# Patient Record
Sex: Male | Born: 1935 | Race: White | Hispanic: No | Marital: Married | Smoking: Former smoker
Health system: Southern US, Community
[De-identification: ages and names within clinical notes are randomized; demographics above are authoritative.]

## PROBLEM LIST (undated history)

## (undated) DIAGNOSIS — M810 Age-related osteoporosis without current pathological fracture: Secondary | ICD-10-CM

## (undated) DIAGNOSIS — E119 Type 2 diabetes mellitus without complications: Secondary | ICD-10-CM

## (undated) DIAGNOSIS — M1990 Unspecified osteoarthritis, unspecified site: Secondary | ICD-10-CM

## (undated) DIAGNOSIS — M109 Gout, unspecified: Secondary | ICD-10-CM

## (undated) DIAGNOSIS — N200 Calculus of kidney: Secondary | ICD-10-CM

## (undated) DIAGNOSIS — F0280 Dementia in other diseases classified elsewhere without behavioral disturbance: Secondary | ICD-10-CM

## (undated) DIAGNOSIS — G309 Alzheimer's disease, unspecified: Secondary | ICD-10-CM

## (undated) DIAGNOSIS — I10 Essential (primary) hypertension: Secondary | ICD-10-CM

## (undated) DIAGNOSIS — M353 Polymyalgia rheumatica: Secondary | ICD-10-CM

## (undated) DIAGNOSIS — N400 Enlarged prostate without lower urinary tract symptoms: Secondary | ICD-10-CM

## (undated) DIAGNOSIS — D4959 Neoplasm of unspecified behavior of other genitourinary organ: Secondary | ICD-10-CM

## (undated) HISTORY — DX: Gout, unspecified: M10.9

## (undated) HISTORY — DX: Polymyalgia rheumatica: M35.3

## (undated) HISTORY — PX: HEMORRHOID SURGERY: SHX153

## (undated) HISTORY — DX: Dementia in other diseases classified elsewhere, unspecified severity, without behavioral disturbance, psychotic disturbance, mood disturbance, and anxiety: F02.80

## (undated) HISTORY — DX: Age-related osteoporosis without current pathological fracture: M81.0

## (undated) HISTORY — DX: Essential (primary) hypertension: I10

## (undated) HISTORY — DX: Benign prostatic hyperplasia without lower urinary tract symptoms: N40.0

## (undated) HISTORY — DX: Alzheimer's disease, unspecified: G30.9

## (undated) HISTORY — DX: Unspecified osteoarthritis, unspecified site: M19.90

## (undated) HISTORY — DX: Neoplasm of unspecified behavior of other genitourinary organ: D49.59

## (undated) HISTORY — DX: Calculus of kidney: N20.0

## (undated) HISTORY — DX: Type 2 diabetes mellitus without complications: E11.9

---

## 2014-05-16 DIAGNOSIS — M150 Primary generalized (osteo)arthritis: Secondary | ICD-10-CM | POA: Diagnosis not present

## 2014-05-16 DIAGNOSIS — E785 Hyperlipidemia, unspecified: Secondary | ICD-10-CM | POA: Diagnosis not present

## 2014-05-16 DIAGNOSIS — I10 Essential (primary) hypertension: Secondary | ICD-10-CM | POA: Diagnosis not present

## 2014-05-16 DIAGNOSIS — Z794 Long term (current) use of insulin: Secondary | ICD-10-CM | POA: Diagnosis not present

## 2014-05-16 DIAGNOSIS — Z7952 Long term (current) use of systemic steroids: Secondary | ICD-10-CM | POA: Diagnosis not present

## 2014-05-16 DIAGNOSIS — E119 Type 2 diabetes mellitus without complications: Secondary | ICD-10-CM | POA: Diagnosis not present

## 2014-05-16 DIAGNOSIS — M353 Polymyalgia rheumatica: Secondary | ICD-10-CM | POA: Diagnosis not present

## 2014-06-16 DIAGNOSIS — I10 Essential (primary) hypertension: Secondary | ICD-10-CM | POA: Diagnosis not present

## 2014-06-16 DIAGNOSIS — Z0000 Encounter for general adult medical examination without abnormal findings: Secondary | ICD-10-CM | POA: Diagnosis not present

## 2014-06-16 DIAGNOSIS — M810 Age-related osteoporosis without current pathological fracture: Secondary | ICD-10-CM | POA: Diagnosis not present

## 2014-06-16 DIAGNOSIS — M109 Gout, unspecified: Secondary | ICD-10-CM | POA: Diagnosis not present

## 2014-06-16 DIAGNOSIS — E785 Hyperlipidemia, unspecified: Secondary | ICD-10-CM | POA: Diagnosis not present

## 2014-06-16 DIAGNOSIS — E119 Type 2 diabetes mellitus without complications: Secondary | ICD-10-CM | POA: Diagnosis not present

## 2014-06-16 DIAGNOSIS — Z1389 Encounter for screening for other disorder: Secondary | ICD-10-CM | POA: Diagnosis not present

## 2014-06-16 DIAGNOSIS — M353 Polymyalgia rheumatica: Secondary | ICD-10-CM | POA: Diagnosis not present

## 2014-06-29 DIAGNOSIS — Z1212 Encounter for screening for malignant neoplasm of rectum: Secondary | ICD-10-CM | POA: Diagnosis not present

## 2014-07-05 DIAGNOSIS — I351 Nonrheumatic aortic (valve) insufficiency: Secondary | ICD-10-CM | POA: Diagnosis not present

## 2014-07-05 DIAGNOSIS — I70209 Unspecified atherosclerosis of native arteries of extremities, unspecified extremity: Secondary | ICD-10-CM | POA: Diagnosis not present

## 2014-07-05 DIAGNOSIS — I2510 Atherosclerotic heart disease of native coronary artery without angina pectoris: Secondary | ICD-10-CM | POA: Diagnosis not present

## 2014-07-06 DIAGNOSIS — R4182 Altered mental status, unspecified: Secondary | ICD-10-CM | POA: Diagnosis not present

## 2014-07-06 DIAGNOSIS — I959 Hypotension, unspecified: Secondary | ICD-10-CM | POA: Diagnosis not present

## 2014-07-06 DIAGNOSIS — I10 Essential (primary) hypertension: Secondary | ICD-10-CM | POA: Diagnosis not present

## 2014-07-18 DIAGNOSIS — R109 Unspecified abdominal pain: Secondary | ICD-10-CM | POA: Diagnosis not present

## 2014-07-18 DIAGNOSIS — K921 Melena: Secondary | ICD-10-CM | POA: Diagnosis not present

## 2014-07-18 DIAGNOSIS — D500 Iron deficiency anemia secondary to blood loss (chronic): Secondary | ICD-10-CM | POA: Diagnosis not present

## 2014-07-21 DIAGNOSIS — R634 Abnormal weight loss: Secondary | ICD-10-CM | POA: Diagnosis not present

## 2014-07-21 DIAGNOSIS — K4020 Bilateral inguinal hernia, without obstruction or gangrene, not specified as recurrent: Secondary | ICD-10-CM | POA: Diagnosis not present

## 2014-07-21 DIAGNOSIS — R102 Pelvic and perineal pain: Secondary | ICD-10-CM | POA: Diagnosis not present

## 2014-07-21 DIAGNOSIS — R109 Unspecified abdominal pain: Secondary | ICD-10-CM | POA: Diagnosis not present

## 2014-07-21 DIAGNOSIS — N261 Atrophy of kidney (terminal): Secondary | ICD-10-CM | POA: Diagnosis not present

## 2014-07-21 DIAGNOSIS — M5126 Other intervertebral disc displacement, lumbar region: Secondary | ICD-10-CM | POA: Diagnosis not present

## 2014-07-21 DIAGNOSIS — K5730 Diverticulosis of large intestine without perforation or abscess without bleeding: Secondary | ICD-10-CM | POA: Diagnosis not present

## 2014-07-31 DIAGNOSIS — I491 Atrial premature depolarization: Secondary | ICD-10-CM | POA: Diagnosis not present

## 2014-07-31 DIAGNOSIS — I498 Other specified cardiac arrhythmias: Secondary | ICD-10-CM | POA: Diagnosis not present

## 2014-07-31 DIAGNOSIS — I499 Cardiac arrhythmia, unspecified: Secondary | ICD-10-CM | POA: Diagnosis not present

## 2014-07-31 DIAGNOSIS — I350 Nonrheumatic aortic (valve) stenosis: Secondary | ICD-10-CM | POA: Diagnosis not present

## 2014-08-24 DIAGNOSIS — R5383 Other fatigue: Secondary | ICD-10-CM | POA: Diagnosis not present

## 2014-08-24 DIAGNOSIS — E119 Type 2 diabetes mellitus without complications: Secondary | ICD-10-CM | POA: Diagnosis not present

## 2014-08-24 DIAGNOSIS — N401 Enlarged prostate with lower urinary tract symptoms: Secondary | ICD-10-CM | POA: Diagnosis not present

## 2014-08-24 DIAGNOSIS — N3090 Cystitis, unspecified without hematuria: Secondary | ICD-10-CM | POA: Diagnosis not present

## 2014-08-24 DIAGNOSIS — C61 Malignant neoplasm of prostate: Secondary | ICD-10-CM | POA: Diagnosis not present

## 2014-08-24 DIAGNOSIS — I10 Essential (primary) hypertension: Secondary | ICD-10-CM | POA: Diagnosis not present

## 2014-09-14 DIAGNOSIS — J029 Acute pharyngitis, unspecified: Secondary | ICD-10-CM | POA: Diagnosis not present

## 2014-09-15 DIAGNOSIS — I10 Essential (primary) hypertension: Secondary | ICD-10-CM | POA: Diagnosis not present

## 2014-09-15 DIAGNOSIS — J309 Allergic rhinitis, unspecified: Secondary | ICD-10-CM | POA: Diagnosis not present

## 2014-09-19 DIAGNOSIS — I10 Essential (primary) hypertension: Secondary | ICD-10-CM | POA: Diagnosis not present

## 2014-09-19 DIAGNOSIS — E785 Hyperlipidemia, unspecified: Secondary | ICD-10-CM | POA: Diagnosis not present

## 2014-09-19 DIAGNOSIS — E119 Type 2 diabetes mellitus without complications: Secondary | ICD-10-CM | POA: Diagnosis not present

## 2014-09-19 DIAGNOSIS — Z794 Long term (current) use of insulin: Secondary | ICD-10-CM | POA: Diagnosis not present

## 2014-10-04 DIAGNOSIS — L578 Other skin changes due to chronic exposure to nonionizing radiation: Secondary | ICD-10-CM | POA: Diagnosis not present

## 2014-10-04 DIAGNOSIS — R600 Localized edema: Secondary | ICD-10-CM | POA: Diagnosis not present

## 2014-10-04 DIAGNOSIS — L821 Other seborrheic keratosis: Secondary | ICD-10-CM | POA: Diagnosis not present

## 2014-10-04 DIAGNOSIS — L570 Actinic keratosis: Secondary | ICD-10-CM | POA: Diagnosis not present

## 2014-12-28 DIAGNOSIS — H2703 Aphakia, bilateral: Secondary | ICD-10-CM | POA: Diagnosis not present

## 2015-02-01 DIAGNOSIS — E119 Type 2 diabetes mellitus without complications: Secondary | ICD-10-CM | POA: Diagnosis not present

## 2015-02-20 DIAGNOSIS — Z794 Long term (current) use of insulin: Secondary | ICD-10-CM | POA: Diagnosis not present

## 2015-02-20 DIAGNOSIS — E785 Hyperlipidemia, unspecified: Secondary | ICD-10-CM | POA: Diagnosis not present

## 2015-02-20 DIAGNOSIS — E119 Type 2 diabetes mellitus without complications: Secondary | ICD-10-CM | POA: Diagnosis not present

## 2015-02-20 DIAGNOSIS — I10 Essential (primary) hypertension: Secondary | ICD-10-CM | POA: Diagnosis not present

## 2015-02-22 DIAGNOSIS — N3020 Other chronic cystitis without hematuria: Secondary | ICD-10-CM | POA: Diagnosis not present

## 2015-02-22 DIAGNOSIS — C61 Malignant neoplasm of prostate: Secondary | ICD-10-CM | POA: Diagnosis not present

## 2015-02-22 DIAGNOSIS — N318 Other neuromuscular dysfunction of bladder: Secondary | ICD-10-CM | POA: Diagnosis not present

## 2015-02-22 DIAGNOSIS — E119 Type 2 diabetes mellitus without complications: Secondary | ICD-10-CM | POA: Diagnosis not present

## 2015-02-22 DIAGNOSIS — I10 Essential (primary) hypertension: Secondary | ICD-10-CM | POA: Diagnosis not present

## 2015-02-22 DIAGNOSIS — N401 Enlarged prostate with lower urinary tract symptoms: Secondary | ICD-10-CM | POA: Diagnosis not present

## 2015-03-14 DIAGNOSIS — J309 Allergic rhinitis, unspecified: Secondary | ICD-10-CM | POA: Diagnosis not present

## 2015-03-14 DIAGNOSIS — Z23 Encounter for immunization: Secondary | ICD-10-CM | POA: Diagnosis not present

## 2015-03-14 DIAGNOSIS — R0609 Other forms of dyspnea: Secondary | ICD-10-CM | POA: Diagnosis not present

## 2015-04-12 ENCOUNTER — Other Ambulatory Visit: Payer: Self-pay

## 2015-04-13 ENCOUNTER — Encounter: Payer: Self-pay | Admitting: Emergency Medicine

## 2015-04-13 ENCOUNTER — Ambulatory Visit (INDEPENDENT_AMBULATORY_CARE_PROVIDER_SITE_OTHER): Payer: Medicare Other | Admitting: Emergency Medicine

## 2015-04-13 ENCOUNTER — Ambulatory Visit (INDEPENDENT_AMBULATORY_CARE_PROVIDER_SITE_OTHER)
Admission: RE | Admit: 2015-04-13 | Discharge: 2015-04-13 | Disposition: A | Discharge status: Home / Self Care | Payer: Medicare Other | Source: Ambulatory Visit | Attending: Emergency Medicine | Admitting: Emergency Medicine

## 2015-04-13 VITALS — BP 112/62 | HR 67 | Ht 64.0 in | Wt 170.0 lb

## 2015-04-13 DIAGNOSIS — R0609 Other forms of dyspnea: Secondary | ICD-10-CM

## 2015-04-13 DIAGNOSIS — J449 Chronic obstructive pulmonary disease, unspecified: Secondary | ICD-10-CM | POA: Insufficient documentation

## 2015-04-13 DIAGNOSIS — R0602 Shortness of breath: Secondary | ICD-10-CM | POA: Diagnosis not present

## 2015-04-13 DIAGNOSIS — Z77090 Contact with and (suspected) exposure to asbestos: Secondary | ICD-10-CM | POA: Diagnosis not present

## 2015-04-13 HISTORY — DX: Chronic obstructive pulmonary disease, unspecified: J44.9

## 2015-04-13 HISTORY — DX: Contact with and (suspected) exposure to asbestos: Z77.090

## 2015-04-13 NOTE — Progress Notes (Signed)
Subjective:    Patient ID: Kevin Hines, male    DOB: 1922-11-08, 81 y.o.   MRN: [id number]  HPI 81 yo man, former remote smoker (5-10 pk-yrs), hx DM, HTN on ACE-I, polymyalgia rheumatica on Pred 5mg , Alzheimer's dementia. He is referred by Dr Nona Dell for evaluation of exertional SOB. He also goes to the [place], had CT chest March 2015 > report available. He had some lingular scar, some granulomas w calcium, some B calcified pleural plaques. He denies any CP. Wt has been stable. He is comfortable at rest, gets dyspneic w exertion. Has to rest when working outside in flower bed. Has episodes of emesis with exertion as well, especially with bending. No wheeze or noise. Rare cough. He may have felt palpitations before - he is unsure. Lots of rhinorrhea. He had a cardiac stress test 07/2014 that was reassuring.    Review of Systems  HENT: Positive for congestion, postnasal drip and sneezing.   Respiratory: Positive for shortness of breath.   Cardiovascular: Positive for palpitations and leg swelling.  Musculoskeletal: Positive for joint swelling.   Past Medical History  Diagnosis Date  . Alzheimer's disease   . Benign prostatic hypertrophy   . Diabetes mellitus ([place])   . Gout   . Hypertension   . Kidney stones   . Osteoarthritis   . Osteoporosis   . Polymyalgia rheumatica ([place])   . Prostate neoplasm      No family history on file.   Social History   Social History  . Marital Status: Married    Spouse Name: N/A  . Number of Children: N/A  . Years of Education: N/A   Occupational History  . Not on file.   Social History Main Topics  . Smoking status: Former Smoker -- 0.03 packs/day    Types: Cigarettes    Quit date: 05/13/1959  . Smokeless tobacco: Never Used  . Alcohol Use: Not on file  . Drug Use: Not on file  . Sexual Activity: Not on file   Other Topics Concern  . 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 Exposed to diesel fuel   Allergies  Allergen Reactions  . Metformin And Related Other (See Comments)     Outpatient Prescriptions Prior to Visit  Medication Sig Dispense Refill  . alendronate (FOSAMAX) 70 MG tablet Take 70 mg by mouth once a week. Take with a full glass of water on an empty stomach.    Marland Kitchen allopurinol (ZYLOPRIM) 300 MG tablet Take 300 mg by mouth daily.    Marland Kitchen amLODipine (NORVASC) 10 MG tablet Take 10 mg by mouth daily.    Marland Kitchen aspirin 81 MG tablet Take 81 mg by mouth daily.    . Cholecalciferol (VITAMIN D3) 10000 UNITS TABS Take 1 tablet by mouth daily.    . hydrALAZINE (APRESOLINE) 25 MG tablet Take 25 mg by mouth 2 (two) times daily.    . hydrochlorothiazide (HYDRODIURIL) 25 MG tablet Take 25 mg by mouth daily.    . insulin aspart (NOVOLOG) 100 UNIT/ML injection Inject 12 Units into the skin 3 (three) times daily with meals.    . insulin glargine (LANTUS) 100 UNIT/ML injection Inject 22 Units into the skin daily.    Marland Kitchen lisinopril (PRINIVIL,ZESTRIL) 40 MG tablet Take 40 mg by mouth 2 (two) times daily.    . metoprolol tartrate (LOPRESSOR) 25 MG tablet Take 25 mg by  mouth 2 (two) times daily.    . Omega-3 Fatty Acids (FISH OIL) 500 MG CAPS Take 1 capsule by mouth 2 (two) times daily.    . pravastatin (PRAVACHOL) 80 MG tablet Take 80 mg by mouth daily.    . predniSONE (DELTASONE) 5 MG tablet Take 5 mg by mouth daily with breakfast.     No facility-administered medications prior to visit.         Objective:   Physical Exam Filed Vitals:   04/13/15 1527  BP: 112/62  Pulse: 67  Height: 5\' 4"  (1.626 m)  Weight: 170 lb (77.111 kg)  SpO2: 95%   'Gen: Pleasant, well-nourished, in no distress,  normal affect  ENT: No lesions,  mouth clear,  oropharynx clear, no postnasal drip  Neck: No JVD, no stridor  Lungs: No use of accessory muscles, clear without rales or rhonchi  Cardiovascular: RRR, heart sounds normal, no murmur or gallops, trace pretibial  peripheral edema  Musculoskeletal: No deformities, no cyanosis or clubbing  Neuro: alert, non focal  Skin: Warm, no lesions or rashes       Assessment & Plan:  Dyspnea on exertion Differential diagnosis is broad and could include obstructive lung disease, pleural disease or pleural effusion from presumed asbestos exposure although I do not hear any evidence of an effusion on exam today. Suspect that there may be some component of deconditioning. We  will perform pulmonary function testing and if any evidence of obstruction we will try bd's. CXR today. Suspect he will need serial chest x-rays versus CT scans to follow pleural plaques.   Asbestos exposure This is presumed based on CT scan report from March 2015 from the [place]. He will need follow-up imaging. He may be able to be followed by Dr Gwenette Greet who is now seeing pt's in [place].

## 2015-04-13 NOTE — Assessment & Plan Note (Signed)
This is presumed based on CT scan report from March 2015 from the [place]. He will need follow-up imaging. He may be able to be followed by Dr Gwenette Greet who is now seeing pt's in [place].

## 2015-04-13 NOTE — Assessment & Plan Note (Signed)
Differential diagnosis is broad and could include obstructive lung disease, pleural disease or pleural effusion from presumed asbestos exposure although I do not hear any evidence of an effusion on exam today. Suspect that there may be some component of deconditioning. We  will perform pulmonary function testing and if any evidence of obstruction we will try bd's. CXR today. Suspect he will need serial chest x-rays versus CT scans to follow pleural plaques.

## 2015-04-13 NOTE — Patient Instructions (Addendum)
We will perform a CXR today We will arrange for pulmonary function testing.  Follow with Dr Lamonte Sakai next available to review.

## 2015-05-17 DIAGNOSIS — M150 Primary generalized (osteo)arthritis: Secondary | ICD-10-CM | POA: Diagnosis not present

## 2015-05-17 DIAGNOSIS — Z79899 Other long term (current) drug therapy: Secondary | ICD-10-CM | POA: Diagnosis not present

## 2015-05-17 DIAGNOSIS — M353 Polymyalgia rheumatica: Secondary | ICD-10-CM | POA: Diagnosis not present

## 2015-06-22 DIAGNOSIS — N3090 Cystitis, unspecified without hematuria: Secondary | ICD-10-CM | POA: Diagnosis not present

## 2015-06-22 DIAGNOSIS — N401 Enlarged prostate with lower urinary tract symptoms: Secondary | ICD-10-CM | POA: Diagnosis not present

## 2015-06-22 DIAGNOSIS — N3020 Other chronic cystitis without hematuria: Secondary | ICD-10-CM | POA: Diagnosis not present

## 2015-06-22 DIAGNOSIS — I10 Essential (primary) hypertension: Secondary | ICD-10-CM | POA: Diagnosis not present

## 2015-06-22 DIAGNOSIS — E1010 Type 1 diabetes mellitus with ketoacidosis without coma: Secondary | ICD-10-CM | POA: Diagnosis not present

## 2015-06-22 DIAGNOSIS — N318 Other neuromuscular dysfunction of bladder: Secondary | ICD-10-CM | POA: Diagnosis not present

## 2015-06-22 DIAGNOSIS — C61 Malignant neoplasm of prostate: Secondary | ICD-10-CM | POA: Diagnosis not present

## 2015-06-26 DIAGNOSIS — M1A9XX0 Chronic gout, unspecified, without tophus (tophi): Secondary | ICD-10-CM | POA: Diagnosis not present

## 2015-06-26 DIAGNOSIS — E559 Vitamin D deficiency, unspecified: Secondary | ICD-10-CM | POA: Diagnosis not present

## 2015-06-26 DIAGNOSIS — N400 Enlarged prostate without lower urinary tract symptoms: Secondary | ICD-10-CM | POA: Diagnosis not present

## 2015-06-26 DIAGNOSIS — Z0000 Encounter for general adult medical examination without abnormal findings: Secondary | ICD-10-CM | POA: Diagnosis not present

## 2015-06-26 DIAGNOSIS — I480 Paroxysmal atrial fibrillation: Secondary | ICD-10-CM | POA: Diagnosis not present

## 2015-06-26 DIAGNOSIS — I10 Essential (primary) hypertension: Secondary | ICD-10-CM | POA: Diagnosis not present

## 2015-06-26 DIAGNOSIS — Z1389 Encounter for screening for other disorder: Secondary | ICD-10-CM | POA: Diagnosis not present

## 2015-06-26 DIAGNOSIS — M810 Age-related osteoporosis without current pathological fracture: Secondary | ICD-10-CM | POA: Diagnosis not present

## 2015-06-26 DIAGNOSIS — E119 Type 2 diabetes mellitus without complications: Secondary | ICD-10-CM | POA: Diagnosis not present

## 2015-06-28 DIAGNOSIS — N400 Enlarged prostate without lower urinary tract symptoms: Secondary | ICD-10-CM | POA: Diagnosis not present

## 2015-06-29 DIAGNOSIS — M818 Other osteoporosis without current pathological fracture: Secondary | ICD-10-CM | POA: Diagnosis not present

## 2015-06-29 DIAGNOSIS — M810 Age-related osteoporosis without current pathological fracture: Secondary | ICD-10-CM | POA: Diagnosis not present

## 2015-06-29 DIAGNOSIS — C61 Malignant neoplasm of prostate: Secondary | ICD-10-CM | POA: Diagnosis not present

## 2015-07-03 ENCOUNTER — Encounter: Payer: Self-pay | Admitting: Emergency Medicine

## 2015-07-03 ENCOUNTER — Ambulatory Visit (INDEPENDENT_AMBULATORY_CARE_PROVIDER_SITE_OTHER): Payer: Medicare Other | Admitting: Emergency Medicine

## 2015-07-03 VITALS — BP 126/58 | HR 84 | Ht 64.0 in | Wt 170.8 lb

## 2015-07-03 DIAGNOSIS — R0609 Other forms of dyspnea: Secondary | ICD-10-CM | POA: Diagnosis not present

## 2015-07-03 DIAGNOSIS — Z77090 Contact with and (suspected) exposure to asbestos: Secondary | ICD-10-CM | POA: Diagnosis not present

## 2015-07-03 DIAGNOSIS — R0600 Dyspnea, unspecified: Secondary | ICD-10-CM

## 2015-07-03 NOTE — Assessment & Plan Note (Signed)
He does not have wheezing on exam. I would like for him to do spirometry today but if this is reassuring think we can almost certainly ascribed some of his exertional dyspnea due to deconditioning. No new effusion on chest x-ray.  We can consider bronchodilators if there is evidence for obstruction but otherwise I would like for him to undertake a steady conservative exercise regimen to try and build up his stamina.

## 2015-07-03 NOTE — Patient Instructions (Addendum)
Your pulmonary function testing today shows some evidence for an airflow abnormality. We should do a trial of albuterol, 2 puffs up to every 4 hours if needed for shortness of breath, to see if you benefit from this. You do not need to use this on a schedule, only when breathing becomes difficult. Keep track of whether you benefit so we can decide whether this is a medication that we can continue.  Follow with Dr Lamonte Sakai in 4 months or sooner if you have any problems.

## 2015-07-03 NOTE — Assessment & Plan Note (Addendum)
His chest x-ray was reassuring from December 2016. No evidence of an evolving effusion. He does not have wheezing on exam. I would like for him to do spirometry today but if this is reassuring think we can almost certainly ascribed some of his exertional dyspnea due to deconditioning. His last CT scan of the chest appears to have been approximately 2 years ago. He will need his pleural plaques to be followed. I will offer to do this here or to have this done through the [place] system. He may want to follow with Dr Gwenette Greet and if so I will work on setting this up.

## 2015-07-03 NOTE — Progress Notes (Signed)
Subjective:    Patient ID: Kevin Hines, male    DOB: 07-11-1922, 82 y.o.   MRN: [id number]  HPI 82 yo man, former remote smoker (5-10 pk-yrs), hx DM, HTN on ACE-I, polymyalgia rheumatica on Pred 5mg , Alzheimer's dementia. He is referred by Dr Nona Dell for evaluation of exertional SOB. He also goes to the [place], had CT chest March 2015 > report available. He had some lingular scar, some granulomas w calcium, some B calcified pleural plaques. He denies any CP. Wt has been stable. He is comfortable at rest, gets dyspneic w exertion. Has to rest when working outside in flower bed. Has episodes of emesis with exertion as well, especially with bending. No wheeze or noise. Rare cough. He may have felt palpitations before - he is unsure. Lots of rhinorrhea. He had a cardiac stress test 07/2014 that was reassuring.   ROV 07/03/15 -- follow-up visit for dyspnea on exertion. He has a history of reported abnormal CT scan of the chest with an effusion and some bilateral calcified pleural plaques. A chest x-ray performed at our last visit on 04/13/15 did show a 7 mm calcified right upper lobe nodule without any other significant abnormalities and with no large pleural effusion.  His breathing hasn't changed much, happens when he walks uphill but not on flat ground.    Review of Systems  HENT: Positive for congestion, postnasal drip and sneezing.   Respiratory: Positive for shortness of breath.   Cardiovascular: Positive for palpitations and leg swelling.  Musculoskeletal: Positive for joint swelling.    Past Medical History  Diagnosis Date  . Alzheimer's disease   . Benign prostatic hypertrophy   . Diabetes mellitus ([place])   . Gout   . Hypertension   . Kidney stones   . Osteoarthritis   . Osteoporosis   . Polymyalgia rheumatica ([place])   . Prostate neoplasm      No family history on file.   Social History   Social History  . Marital Status: Married    Spouse Name: N/A  . Number of Children: N/A    . Years of Education: N/A   Occupational History  . Not on file.   Social History Main Topics  . Smoking status: Former Smoker -- 0.03 packs/day    Types: Cigarettes    Quit date: 05/13/1959  . Smokeless tobacco: Never Used  . Alcohol Use: Not on file  . Drug Use: Not on file  . Sexual Activity: Not on file   Other Topics Concern  . 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 Metformin And Related Other (See Comments)     Outpatient Prescriptions Prior to Visit  Medication Sig Dispense Refill  . alendronate (FOSAMAX) 70 MG tablet Take 70 mg by mouth once a week. Take with a full glass of water on an empty stomach.    Marland Kitchen allopurinol (ZYLOPRIM) 300 MG tablet Take 300 mg by mouth daily.    Marland Kitchen amLODipine (NORVASC) 10 MG tablet Take 10 mg by mouth daily.    Marland Kitchen aspirin 81 MG tablet Take 81 mg by mouth daily.    . Cholecalciferol (VITAMIN D3) 10000 UNITS TABS Take 1 tablet by mouth daily.    . hydrALAZINE (APRESOLINE) 25 MG tablet Take 25 mg by mouth 2 (two) times daily.    . hydrochlorothiazide (  HYDRODIURIL) 25 MG tablet Take 25 mg by mouth daily.    . insulin aspart (NOVOLOG) 100 UNIT/ML injection Inject 12 Units into the skin 3 (three) times daily with meals.    . insulin glargine (LANTUS) 100 UNIT/ML injection Inject 22 Units into the skin daily.    Marland Kitchen lisinopril (PRINIVIL,ZESTRIL) 40 MG tablet Take 40 mg by mouth 2 (two) times daily.    . metoprolol tartrate (LOPRESSOR) 25 MG tablet Take 25 mg by mouth 2 (two) times daily.    . Omega-3 Fatty Acids (FISH OIL) 500 MG CAPS Take 1 capsule by mouth 2 (two) times daily.    . pravastatin (PRAVACHOL) 80 MG tablet Take 80 mg by mouth daily.    . predniSONE (DELTASONE) 5 MG tablet Take 5 mg by mouth daily with breakfast. Pt reported only taking 2.5mg  daily     No facility-administered medications prior to  visit.         Objective:   Physical Exam Filed Vitals:   07/03/15 1355  BP: 126/58  Pulse: 84  Height: 5\' 4"  (1.626 m)  Weight: 170 lb 12.8 oz (77.474 kg)  SpO2: 97%   'Gen: Pleasant, well-nourished, in no distress,  normal affect  ENT: No lesions,  mouth clear,  oropharynx clear, no postnasal drip  Neck: No JVD, no stridor  Lungs: No use of accessory muscles, clear without rales or rhonchi  Cardiovascular: RRR, heart sounds normal, no murmur or gallops, trace pretibial peripheral edema  Musculoskeletal: No deformities, no cyanosis or clubbing  Neuro: alert, non focal  Skin: Warm, no lesions or rashes       Assessment & Plan:  Asbestos exposure His chest x-ray was reassuring from December 2016. No evidence of an evolving effusion. He does not have wheezing on exam. I would like for him to do spirometry today but if this is reassuring think we can almost certainly ascribed some of his exertional dyspnea due to deconditioning. His last CT scan of the chest appears to have been approximately 2 years ago. He will need his pleural plaques to be followed. I will offer to do this here or to have this done through the [place] system. He may want to follow with Dr Gwenette Greet and if so I will work on setting this up.   Dyspnea on exertion He does not have wheezing on exam. I would like for him to do spirometry today but if this is reassuring think we can almost certainly ascribed some of his exertional dyspnea due to deconditioning. No new effusion on chest x-ray.  We can consider bronchodilators if there is evidence for obstruction but otherwise I would like for him to undertake a steady conservative exercise regimen to try and build up his stamina.

## 2015-07-04 ENCOUNTER — Telehealth: Payer: Self-pay | Admitting: Emergency Medicine

## 2015-07-04 MED ORDER — ALBUTEROL SULFATE HFA 108 (90 BASE) MCG/ACT IN AERS: 2.0000 | INHALATION_SPRAY | RESPIRATORY_TRACT | Status: DC | PRN

## 2015-07-04 NOTE — Telephone Encounter (Signed)
Per 07/03/15 OV: Patient Instructions       Your pulmonary function testing today shows some evidence for an airflow abnormality. We should do a trial of albuterol, 2 puffs up to every 4 hours if needed for shortness of breath, to see if you benefit from this. You do not need to use this on a schedule, only when breathing becomes difficult. Keep track of whether you benefit so we can decide whether this is a medication that we can continue.  Follow with Dr Lamonte Sakai in 4 months or sooner if you have any problems  ---- Called spoke with spouse. The albuterol inhaler was not called in from [facility]. I have done so. Nothing further needed

## 2015-07-24 DIAGNOSIS — I10 Essential (primary) hypertension: Secondary | ICD-10-CM

## 2015-07-24 DIAGNOSIS — E119 Type 2 diabetes mellitus without complications: Secondary | ICD-10-CM | POA: Insufficient documentation

## 2015-07-24 DIAGNOSIS — Z794 Long term (current) use of insulin: Secondary | ICD-10-CM

## 2015-07-24 DIAGNOSIS — Z92241 Personal history of systemic steroid therapy: Secondary | ICD-10-CM

## 2015-07-24 DIAGNOSIS — E785 Hyperlipidemia, unspecified: Secondary | ICD-10-CM

## 2015-07-24 HISTORY — DX: Type 2 diabetes mellitus without complications: E11.9

## 2015-07-24 HISTORY — DX: Essential (primary) hypertension: I10

## 2015-07-24 HISTORY — DX: Hyperlipidemia, unspecified: E78.5

## 2015-07-24 HISTORY — DX: Type 2 diabetes mellitus without complications: Z79.4

## 2015-07-24 HISTORY — DX: Personal history of systemic steroid therapy: Z92.241

## 2015-08-24 DIAGNOSIS — E119 Type 2 diabetes mellitus without complications: Secondary | ICD-10-CM | POA: Diagnosis not present

## 2015-11-01 ENCOUNTER — Encounter: Payer: Self-pay | Admitting: Emergency Medicine

## 2015-11-01 ENCOUNTER — Ambulatory Visit (INDEPENDENT_AMBULATORY_CARE_PROVIDER_SITE_OTHER): Payer: Medicare Other | Admitting: Emergency Medicine

## 2015-11-01 VITALS — BP 112/58 | HR 65 | Ht 64.0 in | Wt 172.0 lb

## 2015-11-01 DIAGNOSIS — J449 Chronic obstructive pulmonary disease, unspecified: Secondary | ICD-10-CM | POA: Diagnosis not present

## 2015-11-01 MED ORDER — ALBUTEROL SULFATE HFA 108 (90 BASE) MCG/ACT IN AERS: 2.0000 | INHALATION_SPRAY | RESPIRATORY_TRACT | Status: DC | PRN

## 2015-11-01 NOTE — Addendum Note (Signed)
Addended by: Beckie Busing on: 11/01/2015 09:38 AM   Modules accepted: Orders

## 2015-11-01 NOTE — Progress Notes (Signed)
Subjective:    Patient ID: Kevin Hines, male    DOB: Mar 22, 1923, 82 y.o.   MRN: [id number]  HPI 82 yo man, former remote smoker (5-10 pk-yrs), hx DM, HTN on ACE-I, polymyalgia rheumatica on Pred 5mg , Alzheimer's dementia. He is referred by Dr Nona Dell for evaluation of exertional SOB. He also goes to the [place], had CT chest March 2015 > report available. He had some lingular scar, some granulomas w calcium, some B calcified pleural plaques. He denies any CP. Wt has been stable. He is comfortable at rest, gets dyspneic w exertion. Has to rest when working outside in flower bed. Has episodes of emesis with exertion as well, especially with bending. No wheeze or noise. Rare cough. He may have felt palpitations before - he is unsure. Lots of rhinorrhea. He had a cardiac stress test 07/2014 that was reassuring.   ROV 07/03/15 -- follow-up visit for dyspnea on exertion. He has a history of reported abnormal CT scan of the chest with an effusion and some bilateral calcified pleural plaques. A chest x-ray performed at our last visit on 04/13/15 did show a 7 mm calcified right upper lobe nodule without any other significant abnormalities and with no large pleural effusion.  His breathing hasn't changed much, happens when he walks uphill but not on flat ground.   ROV 10/31/15 -- patient follows up for history of tobacco use,abnormal CT scan of the chest with some granulomatous disease, calcified nodules and bilateral calcified pleural plaquing. Last seen in February. He has some documented airflow obstruction on spirometry.  He has been using SABA prn - has used with some DOE with exertion. He has noticed a benefit, notices that he can do a bit more, recovers quickly.    Review of Systems  HENT: Positive for postnasal drip and sneezing. Negative for congestion.   Respiratory: Positive for shortness of breath.   Cardiovascular: Positive for leg swelling. Negative for palpitations.  Musculoskeletal: Negative for  joint swelling.    Past Medical History  Diagnosis Date  . Alzheimer's disease   . Benign prostatic hypertrophy   . Diabetes mellitus ([place])   . Gout   . Hypertension   . Kidney stones   . Osteoarthritis   . Osteoporosis   . Polymyalgia rheumatica ([place])   . Prostate neoplasm      No family history on file.   Social History   Social History  . Marital Status: Married    Spouse Name: N/A  . Number of Children: N/A  . Years of Education: N/A   Occupational History  . Not on file.   Social History Main Topics  . Smoking status: Former Smoker -- 0.03 packs/day    Types: Cigarettes    Quit date: 05/13/1959  . Smokeless tobacco: Never Used  . Alcohol Use: Not on file  . Drug Use: Not on file  . Sexual Activity: Not on file   Other Topics Concern  . 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 Metformin And Related Other (See Comments)     Outpatient Prescriptions Prior to Visit  Medication Sig Dispense Refill  . albuterol (PROVENTIL HFA;VENTOLIN HFA) 108 (90 Base) MCG/ACT inhaler Inhale 2 puffs into the lungs every 4 (four) hours as needed for wheezing or shortness of breath. 1 Inhaler 6  . alendronate (FOSAMAX)  70 MG tablet Take 70 mg by mouth once a week. Take with a full glass of water on an empty stomach.    Marland Kitchen allopurinol (ZYLOPRIM) 300 MG tablet Take 300 mg by mouth daily.    Marland Kitchen amLODipine (NORVASC) 10 MG tablet Take 10 mg by mouth daily.    Marland Kitchen aspirin 81 MG tablet Take 81 mg by mouth daily.    . Cholecalciferol (VITAMIN D3) 10000 UNITS TABS Take 1 tablet by mouth daily.    . hydrALAZINE (APRESOLINE) 25 MG tablet Take 25 mg by mouth 2 (two) times daily.    . hydrochlorothiazide (HYDRODIURIL) 25 MG tablet Take 25 mg by mouth daily.    . insulin aspart (NOVOLOG) 100 UNIT/ML injection Inject 12 Units into the skin 3 (three) times daily  with meals.    . insulin glargine (LANTUS) 100 UNIT/ML injection Inject 22 Units into the skin daily.    Marland Kitchen lisinopril (PRINIVIL,ZESTRIL) 40 MG tablet Take 40 mg by mouth 2 (two) times daily.    . metoprolol tartrate (LOPRESSOR) 25 MG tablet Take 25 mg by mouth 2 (two) times daily.    . Omega-3 Fatty Acids (FISH OIL) 500 MG CAPS Take 1 capsule by mouth 2 (two) times daily.    . pravastatin (PRAVACHOL) 80 MG tablet Take 80 mg by mouth daily.    . predniSONE (DELTASONE) 5 MG tablet Take 5 mg by mouth daily with breakfast. Pt reported only taking 2.5mg  daily     No facility-administered medications prior to visit.         Objective:   Physical Exam Filed Vitals:   11/01/15 0905  BP: 112/58  Pulse: 65  Height: 5\' 4"  (1.626 m)  Weight: 172 lb (78.019 kg)  SpO2: 94%   'Gen: Pleasant, well-nourished, in no distress,  normal affect  ENT: No lesions,  mouth clear,  oropharynx clear, no postnasal drip  Neck: No JVD, no stridor  Lungs: No use of accessory muscles, clear without rales or rhonchi  Cardiovascular: RRR, heart sounds normal, no murmur or gallops, trace pretibial peripheral edema  Musculoskeletal: No deformities, no cyanosis or clubbing  Neuro: alert, non focal  Skin: Warm, no lesions or rashes       Assessment & Plan:  COPD (chronic obstructive pulmonary disease) (HCC) Obstruction on spirometry and dyspnea that has improved with albuterol both consistent with mild COPD. He seems to benefit from albuterol, is using it 1-2 times a week. We discussed possibly starting a long-acting schedule bronchodilator but at this time we will defer. He will continue to use his albuterol, keep [place] apprised of how frequently he requires it. If his usage increases we will consider long-acting meds. Follow-up in 12 months or when necessary

## 2015-11-01 NOTE — Patient Instructions (Signed)
We will continue your ProAir albuterol 2 puffs up to every 4 hours if needed for shortness of breath.  Depending how you are doing, how much inhaler you are using we will decide in the future whether to change the medication to an every-day inhaler.  Follow with Dr Lamonte Sakai in 12 months or sooner if you have any problems

## 2015-11-01 NOTE — Assessment & Plan Note (Signed)
Obstruction on spirometry and dyspnea that has improved with albuterol both consistent with mild COPD. He seems to benefit from albuterol, is using it 1-2 times a week. We discussed possibly starting a long-acting schedule bronchodilator but at this time we will defer. He will continue to use his albuterol, keep [place] apprised of how frequently he requires it. If his usage increases we will consider long-acting meds. Follow-up in 12 months or when necessary

## 2015-12-20 DIAGNOSIS — N3020 Other chronic cystitis without hematuria: Secondary | ICD-10-CM | POA: Diagnosis not present

## 2015-12-20 DIAGNOSIS — N318 Other neuromuscular dysfunction of bladder: Secondary | ICD-10-CM | POA: Diagnosis not present

## 2015-12-20 DIAGNOSIS — C61 Malignant neoplasm of prostate: Secondary | ICD-10-CM | POA: Diagnosis not present

## 2015-12-20 DIAGNOSIS — I10 Essential (primary) hypertension: Secondary | ICD-10-CM | POA: Diagnosis not present

## 2015-12-26 DIAGNOSIS — E119 Type 2 diabetes mellitus without complications: Secondary | ICD-10-CM | POA: Diagnosis not present

## 2015-12-26 DIAGNOSIS — I10 Essential (primary) hypertension: Secondary | ICD-10-CM | POA: Diagnosis not present

## 2015-12-26 DIAGNOSIS — I4891 Unspecified atrial fibrillation: Secondary | ICD-10-CM | POA: Diagnosis not present

## 2015-12-31 ENCOUNTER — Telehealth: Payer: Self-pay | Admitting: Emergency Medicine

## 2015-12-31 MED ORDER — ALBUTEROL SULFATE HFA 108 (90 BASE) MCG/ACT IN AERS: 2.0000 | INHALATION_SPRAY | RESPIRATORY_TRACT | 6 refills | Status: DC | PRN

## 2015-12-31 NOTE — Telephone Encounter (Signed)
Rx has been signed and faxed in. Nothing further was needed.

## 2015-12-31 NOTE — Telephone Encounter (Signed)
Called and spoke with pts daughter and she is requesting that [facility] H[facility] Herold Harms.  This rx has been printed off and placed on RB desk to be signed. Will forward to lindsay to follow up on. thanks

## 2016-01-04 DIAGNOSIS — H2703 Aphakia, bilateral: Secondary | ICD-10-CM | POA: Diagnosis not present

## 2016-01-04 DIAGNOSIS — E119 Type 2 diabetes mellitus without complications: Secondary | ICD-10-CM | POA: Diagnosis not present

## 2016-01-24 DIAGNOSIS — I10 Essential (primary) hypertension: Secondary | ICD-10-CM | POA: Diagnosis not present

## 2016-01-24 DIAGNOSIS — E785 Hyperlipidemia, unspecified: Secondary | ICD-10-CM | POA: Diagnosis not present

## 2016-01-24 DIAGNOSIS — E119 Type 2 diabetes mellitus without complications: Secondary | ICD-10-CM | POA: Diagnosis not present

## 2016-01-24 DIAGNOSIS — Z794 Long term (current) use of insulin: Secondary | ICD-10-CM | POA: Diagnosis not present

## 2016-02-11 DIAGNOSIS — Z23 Encounter for immunization: Secondary | ICD-10-CM | POA: Diagnosis not present

## 2016-02-18 DIAGNOSIS — M810 Age-related osteoporosis without current pathological fracture: Secondary | ICD-10-CM | POA: Diagnosis not present

## 2016-02-25 DIAGNOSIS — M818 Other osteoporosis without current pathological fracture: Secondary | ICD-10-CM | POA: Diagnosis not present

## 2016-03-19 DIAGNOSIS — C61 Malignant neoplasm of prostate: Secondary | ICD-10-CM | POA: Diagnosis not present

## 2016-03-20 DIAGNOSIS — K5900 Constipation, unspecified: Secondary | ICD-10-CM | POA: Diagnosis not present

## 2016-06-16 DIAGNOSIS — M150 Primary generalized (osteo)arthritis: Secondary | ICD-10-CM | POA: Diagnosis not present

## 2016-06-16 DIAGNOSIS — Z79899 Other long term (current) drug therapy: Secondary | ICD-10-CM | POA: Diagnosis not present

## 2016-06-16 DIAGNOSIS — M353 Polymyalgia rheumatica: Secondary | ICD-10-CM | POA: Diagnosis not present

## 2016-06-19 DIAGNOSIS — C61 Malignant neoplasm of prostate: Secondary | ICD-10-CM | POA: Diagnosis not present

## 2016-06-19 DIAGNOSIS — N318 Other neuromuscular dysfunction of bladder: Secondary | ICD-10-CM | POA: Diagnosis not present

## 2016-06-19 DIAGNOSIS — N3020 Other chronic cystitis without hematuria: Secondary | ICD-10-CM | POA: Diagnosis not present

## 2016-07-04 DIAGNOSIS — E119 Type 2 diabetes mellitus without complications: Secondary | ICD-10-CM | POA: Diagnosis not present

## 2016-07-04 DIAGNOSIS — E7800 Pure hypercholesterolemia, unspecified: Secondary | ICD-10-CM | POA: Diagnosis not present

## 2016-07-04 DIAGNOSIS — Z0000 Encounter for general adult medical examination without abnormal findings: Secondary | ICD-10-CM | POA: Diagnosis not present

## 2016-07-04 DIAGNOSIS — G309 Alzheimer's disease, unspecified: Secondary | ICD-10-CM | POA: Diagnosis not present

## 2016-07-04 DIAGNOSIS — Z1389 Encounter for screening for other disorder: Secondary | ICD-10-CM | POA: Diagnosis not present

## 2016-07-04 DIAGNOSIS — E559 Vitamin D deficiency, unspecified: Secondary | ICD-10-CM | POA: Diagnosis not present

## 2016-07-04 DIAGNOSIS — I10 Essential (primary) hypertension: Secondary | ICD-10-CM | POA: Diagnosis not present

## 2016-07-23 DIAGNOSIS — E119 Type 2 diabetes mellitus without complications: Secondary | ICD-10-CM | POA: Diagnosis not present

## 2016-07-23 DIAGNOSIS — E785 Hyperlipidemia, unspecified: Secondary | ICD-10-CM | POA: Diagnosis not present

## 2016-07-23 DIAGNOSIS — Z794 Long term (current) use of insulin: Secondary | ICD-10-CM | POA: Diagnosis not present

## 2016-07-23 DIAGNOSIS — I10 Essential (primary) hypertension: Secondary | ICD-10-CM | POA: Diagnosis not present

## 2016-07-28 DIAGNOSIS — Z1212 Encounter for screening for malignant neoplasm of rectum: Secondary | ICD-10-CM | POA: Diagnosis not present

## 2016-08-07 DIAGNOSIS — J40 Bronchitis, not specified as acute or chronic: Secondary | ICD-10-CM | POA: Diagnosis not present

## 2016-09-03 DIAGNOSIS — F0390 Unspecified dementia without behavioral disturbance: Secondary | ICD-10-CM | POA: Insufficient documentation

## 2016-09-03 DIAGNOSIS — C61 Malignant neoplasm of prostate: Secondary | ICD-10-CM

## 2016-09-03 DIAGNOSIS — R319 Hematuria, unspecified: Secondary | ICD-10-CM

## 2016-09-03 DIAGNOSIS — K621 Rectal polyp: Secondary | ICD-10-CM | POA: Insufficient documentation

## 2016-09-03 HISTORY — DX: Hematuria, unspecified: R31.9

## 2016-09-03 HISTORY — DX: Rectal polyp: K62.1

## 2016-09-03 HISTORY — DX: Malignant neoplasm of prostate: C61

## 2016-09-25 DIAGNOSIS — N200 Calculus of kidney: Secondary | ICD-10-CM

## 2016-09-25 HISTORY — DX: Calculus of kidney: N20.0

## 2016-09-30 DIAGNOSIS — M353 Polymyalgia rheumatica: Secondary | ICD-10-CM | POA: Insufficient documentation

## 2016-11-03 ENCOUNTER — Ambulatory Visit (INDEPENDENT_AMBULATORY_CARE_PROVIDER_SITE_OTHER): Payer: Medicare Other | Admitting: Emergency Medicine

## 2016-11-03 ENCOUNTER — Encounter: Payer: Self-pay | Admitting: Emergency Medicine

## 2016-11-03 ENCOUNTER — Ambulatory Visit (INDEPENDENT_AMBULATORY_CARE_PROVIDER_SITE_OTHER)
Admission: RE | Admit: 2016-11-03 | Discharge: 2016-11-03 | Disposition: A | Discharge status: Home / Self Care | Payer: Medicare Other | Source: Ambulatory Visit | Attending: Emergency Medicine | Admitting: Emergency Medicine

## 2016-11-03 VITALS — BP 122/87 | HR 78 | Ht 64.0 in | Wt 162.0 lb

## 2016-11-03 DIAGNOSIS — J929 Pleural plaque without asbestos: Secondary | ICD-10-CM | POA: Diagnosis not present

## 2016-11-03 DIAGNOSIS — Z77090 Contact with and (suspected) exposure to asbestos: Secondary | ICD-10-CM

## 2016-11-03 MED ORDER — ALBUTEROL SULFATE HFA 108 (90 BASE) MCG/ACT IN AERS: 2.0000 | INHALATION_SPRAY | RESPIRATORY_TRACT | 6 refills | Status: DC | PRN

## 2016-11-03 MED ORDER — ALBUTEROL SULFATE HFA 108 (90 BASE) MCG/ACT IN AERS: 2.0000 | INHALATION_SPRAY | RESPIRATORY_TRACT | 6 refills | Status: AC | PRN

## 2016-11-03 NOTE — Assessment & Plan Note (Signed)
With pleural plaques noted on CT. We've been following these with serial chest x-rays and they have been stable. B chest x-ray today. He will continue to follow annually with his primary care physician Dr. Garlon Hatchet. If any change or any new concerning symptoms then I've asked him to come back or to be referred back so that we can troubleshoot.

## 2016-11-03 NOTE — Assessment & Plan Note (Signed)
Managing with albuterol as needed. Discussed possibly starting maintenance daily medication but for now he would like to defer. I believe that this is reasonable. Follow-up when necessary.

## 2016-11-03 NOTE — Progress Notes (Signed)
Subjective:    Patient ID: Kevin Hines, male    DOB: 10/12/1922, 83 y.o.   MRN: [id number]  HPI 83 yo man, former remote smoker (5-10 pk-yrs), hx DM, HTN on ACE-I, polymyalgia rheumatica on Pred 5mg , Alzheimer's dementia. He is referred by Dr Nona Dell for evaluation of exertional SOB. He also goes to the [place], had CT chest March 2015 > report available. He had some lingular scar, some granulomas w calcium, some B calcified pleural plaques. He denies any CP. Wt has been stable. He is comfortable at rest, gets dyspneic w exertion. Has to rest when working outside in flower bed. Has episodes of emesis with exertion as well, especially with bending. No wheeze or noise. Rare cough. He may have felt palpitations before - he is unsure. Lots of rhinorrhea. He had a cardiac stress test 07/2014 that was reassuring.   ROV 07/03/15 -- follow-up visit for dyspnea on exertion. He has a history of reported abnormal CT scan of the chest with an effusion and some bilateral calcified pleural plaques. A chest x-ray performed at our last visit on 04/13/15 did show a 7 mm calcified right upper lobe nodule without any other significant abnormalities and with no large pleural effusion.  His breathing hasn't changed much, happens when he walks uphill but not on flat ground.   ROV 10/31/15 -- patient follows up for history of tobacco use,abnormal CT scan of the chest with some granulomatous disease, calcified nodules and bilateral calcified pleural plaquing. Last seen in February. He has some documented airflow obstruction on spirometry.  He has been using SABA prn - has used with some DOE with exertion. He has noticed a benefit, notices that he can do a bit more, recovers quickly.   ROV 11/03/16 -- 83 year old gentleman last seen one year ago for his history of abnormal CT scan of the chest (granulomatous disease, calcified nodules, bilateral calcified pleural plaquing) and mild obstructive lung disease. He has a history of  tobacco use and has using albuterol as needed. He reports that he uses approximately . Last CT scan available was performed 07/21/13. He had a chest x-ray 04/13/15 that showed hyperinflation, 7 mm right upper lobe primary nodule with calcium, no pleural effusion. He reports that his breathing has been fair;ly stable. He does not use albuterol every day, uses it for SOB w exertion - seems to help him.   New PCP is Teressa Lower in [place].    Review of Systems  HENT: Positive for postnasal drip and sneezing. Negative for congestion.   Respiratory: Positive for shortness of breath.   Cardiovascular: Positive for leg swelling. Negative for palpitations.  Musculoskeletal: Negative for joint swelling.    Past Medical History:  Diagnosis Date  . Alzheimer's disease   . Benign prostatic hypertrophy   . Diabetes mellitus ([place])   . Gout   . Hypertension   . Kidney stones   . Osteoarthritis   . Osteoporosis   . Polymyalgia rheumatica ([place])   . Prostate neoplasm      No family history on file.   Social History   Social History  . Marital status: Married    Spouse name: N/A  . Number of children: N/A  . Years of education: N/A   Occupational History  . Not on file.   Social History Main Topics  . Smoking status: Former Smoker    Packs/day: 0.03    Types: Cigarettes    Quit date: 05/13/1959  . Smokeless tobacco:  Never Used  . Alcohol use Not on file  . Drug use: Unknown  . Sexual activity: Not on file   Other Topics Concern  . Not on file   Social History Narrative  . No narrative on file  worked as a Administrator Was in [employer], went to [place], then [place]. Exposed to diesel fuel   Allergies  Allergen Reactions  . Metformin And Related Other (See Comments)     Outpatient Medications Prior to Visit  Medication Sig Dispense Refill  . aspirin 81 MG tablet Take 81 mg by mouth daily.    . Cholecalciferol (VITAMIN D3) 10000 UNITS TABS Take 1 tablet by mouth daily.      . insulin aspart (NOVOLOG) 100 UNIT/ML injection Inject 12 Units into the skin 3 (three) times daily with meals.    . insulin glargine (LANTUS) 100 UNIT/ML injection Inject 22 Units into the skin daily.    Marland Kitchen lisinopril (PRINIVIL,ZESTRIL) 40 MG tablet Take 40 mg by mouth 2 (two) times daily.    . Omega-3 Fatty Acids (FISH OIL) 500 MG CAPS Take 1 capsule by mouth 2 (two) times daily.    . pravastatin (PRAVACHOL) 80 MG tablet Take 80 mg by mouth daily.    . predniSONE (DELTASONE) 5 MG tablet Take 5 mg by mouth daily with breakfast. Pt reported only taking 2.5mg  daily    . albuterol (PROVENTIL HFA;VENTOLIN HFA) 108 (90 Base) MCG/ACT inhaler Inhale 2 puffs into the lungs every 4 (four) hours as needed for wheezing or shortness of breath. 1 Inhaler 6  . alendronate (FOSAMAX) 70 MG tablet Take 70 mg by mouth once a week. Take with a full glass of water on an empty stomach.    Marland Kitchen allopurinol (ZYLOPRIM) 300 MG tablet Take 300 mg by mouth daily.    Marland Kitchen amLODipine (NORVASC) 10 MG tablet Take 10 mg by mouth daily.    . hydrALAZINE (APRESOLINE) 25 MG tablet Take 25 mg by mouth 2 (two) times daily.    . hydrochlorothiazide (HYDRODIURIL) 25 MG tablet Take 25 mg by mouth daily.    . metoprolol tartrate (LOPRESSOR) 25 MG tablet Take 25 mg by mouth 2 (two) times daily.     No facility-administered medications prior to visit.          Objective:   Physical Exam Vitals:   11/03/16 0900  BP: 122/87  Pulse: 78  SpO2: 96%  Weight: 162 lb (73.5 kg)  Height: 5\' 4"  (1.626 m)   'Gen: Pleasant, well-nourished, in no distress,  normal affect  ENT: No lesions,  mouth clear,  oropharynx clear, no postnasal drip  Neck: No JVD, no stridor  Lungs: No use of accessory muscles, clear without rales or rhonchi  Cardiovascular: RRR, heart sounds normal, no murmur or gallops, trace pretibial peripheral edema  Musculoskeletal: No deformities, no cyanosis or clubbing  Neuro: alert, non focal  Skin: Warm, no lesions  or rashes       Assessment & Plan:  Asbestos exposure With pleural plaques noted on CT. We've been following these with serial chest x-rays and they have been stable. B chest x-ray today. He will continue to follow annually with his primary care physician Dr. Garlon Hatchet. If any change or any new concerning symptoms then I've asked him to come back or to be referred back so that we can troubleshoot.  COPD (chronic obstructive pulmonary disease) ([place]) Managing with albuterol as needed. Discussed possibly starting maintenance daily medication but for now he would  like to defer. I believe that this is reasonable. Follow-up when necessary.  Baltazar Apo, MD, PhD 11/03/2016, 9:30 AM [facility] [phone] or if no answer [phone]

## 2016-11-03 NOTE — Patient Instructions (Addendum)
Please continue to use you albuterol inhaler, 2 puffs when needed for any shortness of breath We will perform a CXR today to compare with your priors.  Follow with Dr Garlon Hatchet as planned. He can follow your breathing and annual CXR for stability.  Follow with Dr Lamonte Sakai for any changes or any problems

## 2016-11-04 ENCOUNTER — Telehealth: Payer: Self-pay

## 2016-11-04 NOTE — Telephone Encounter (Signed)
Started PA on [employer] through [employer] because the pharmacy stated they tried to run the Rx through different manufacturers like Ventolin but the Rx was still not covered.   Philipp Ovens KeyJohn Giovanni - PA Case ID: [id number] - Rx #: [id number]   Will rout e to [facility] Comment for follow up.

## 2016-11-05 NOTE — Telephone Encounter (Signed)
Checked Cover My Meds. PA was denied.  RB - please advise. Thanks.

## 2016-11-05 NOTE — Telephone Encounter (Signed)
We will work to confirm that no form of albuterol is covered before we do PA.

## 2016-11-05 NOTE — Telephone Encounter (Signed)
Contacted pt's insurance company at [phone]. Was instructed the pt will have to call member services to find out what medications are covered. This information could not be released to me.  Called and spoke with pt's daughter, Kevin Hines. States that pt does not have prescription drug coverage. He gets his medications from the [facility]. Pt does not need a prescription for albuterol at this time. Kevin Hines will call us when the pt needs this and we can fax it to the [place]. Nothing further was needed.

## 2017-02-13 DIAGNOSIS — M818 Other osteoporosis without current pathological fracture: Secondary | ICD-10-CM

## 2017-02-13 DIAGNOSIS — T380X5A Adverse effect of glucocorticoids and synthetic analogues, initial encounter: Secondary | ICD-10-CM | POA: Insufficient documentation

## 2017-02-13 HISTORY — DX: Other osteoporosis without current pathological fracture: M81.8

## 2017-04-04 DIAGNOSIS — N179 Acute kidney failure, unspecified: Secondary | ICD-10-CM | POA: Diagnosis not present

## 2017-04-04 DIAGNOSIS — K922 Gastrointestinal hemorrhage, unspecified: Secondary | ICD-10-CM

## 2017-04-04 DIAGNOSIS — D62 Acute posthemorrhagic anemia: Secondary | ICD-10-CM | POA: Diagnosis not present

## 2017-04-04 DIAGNOSIS — C61 Malignant neoplasm of prostate: Secondary | ICD-10-CM | POA: Diagnosis not present

## 2017-04-04 DIAGNOSIS — E119 Type 2 diabetes mellitus without complications: Secondary | ICD-10-CM

## 2017-04-05 DIAGNOSIS — K922 Gastrointestinal hemorrhage, unspecified: Secondary | ICD-10-CM | POA: Diagnosis not present

## 2017-04-05 DIAGNOSIS — N179 Acute kidney failure, unspecified: Secondary | ICD-10-CM | POA: Diagnosis not present

## 2017-04-05 DIAGNOSIS — D62 Acute posthemorrhagic anemia: Secondary | ICD-10-CM | POA: Diagnosis not present

## 2017-04-05 DIAGNOSIS — E119 Type 2 diabetes mellitus without complications: Secondary | ICD-10-CM | POA: Diagnosis not present

## 2017-04-06 DIAGNOSIS — N179 Acute kidney failure, unspecified: Secondary | ICD-10-CM | POA: Diagnosis not present

## 2017-04-06 DIAGNOSIS — D62 Acute posthemorrhagic anemia: Secondary | ICD-10-CM | POA: Diagnosis not present

## 2017-04-06 DIAGNOSIS — K922 Gastrointestinal hemorrhage, unspecified: Secondary | ICD-10-CM | POA: Diagnosis not present

## 2017-04-06 DIAGNOSIS — E119 Type 2 diabetes mellitus without complications: Secondary | ICD-10-CM | POA: Diagnosis not present

## 2017-04-06 DIAGNOSIS — C61 Malignant neoplasm of prostate: Secondary | ICD-10-CM | POA: Diagnosis not present

## 2017-04-07 DIAGNOSIS — E119 Type 2 diabetes mellitus without complications: Secondary | ICD-10-CM | POA: Diagnosis not present

## 2017-04-07 DIAGNOSIS — D62 Acute posthemorrhagic anemia: Secondary | ICD-10-CM | POA: Diagnosis not present

## 2017-04-07 DIAGNOSIS — N179 Acute kidney failure, unspecified: Secondary | ICD-10-CM | POA: Diagnosis not present

## 2017-04-07 DIAGNOSIS — K922 Gastrointestinal hemorrhage, unspecified: Secondary | ICD-10-CM | POA: Diagnosis not present

## 2017-04-12 DIAGNOSIS — K5730 Diverticulosis of large intestine without perforation or abscess without bleeding: Secondary | ICD-10-CM | POA: Insufficient documentation

## 2017-04-12 DIAGNOSIS — D62 Acute posthemorrhagic anemia: Secondary | ICD-10-CM

## 2017-04-12 HISTORY — DX: Acute posthemorrhagic anemia: D62

## 2017-04-12 HISTORY — DX: Diverticulosis of large intestine without perforation or abscess without bleeding: K57.30

## 2017-04-26 DIAGNOSIS — N1832 Chronic kidney disease, stage 3b: Secondary | ICD-10-CM | POA: Insufficient documentation

## 2017-04-26 HISTORY — DX: Chronic kidney disease, stage 3b: N18.32

## 2018-02-09 IMAGING — DX DG CHEST 2V
2 series · 2 of 2 positions shown · non-contrast
Comparison: Chest x-ray 04/13/2015.

CLINICAL DATA: [AGE] male currently asymptomatic. History of
diabetes and hypertension.

EXAM:
CHEST  2 VIEW

[chest pa]
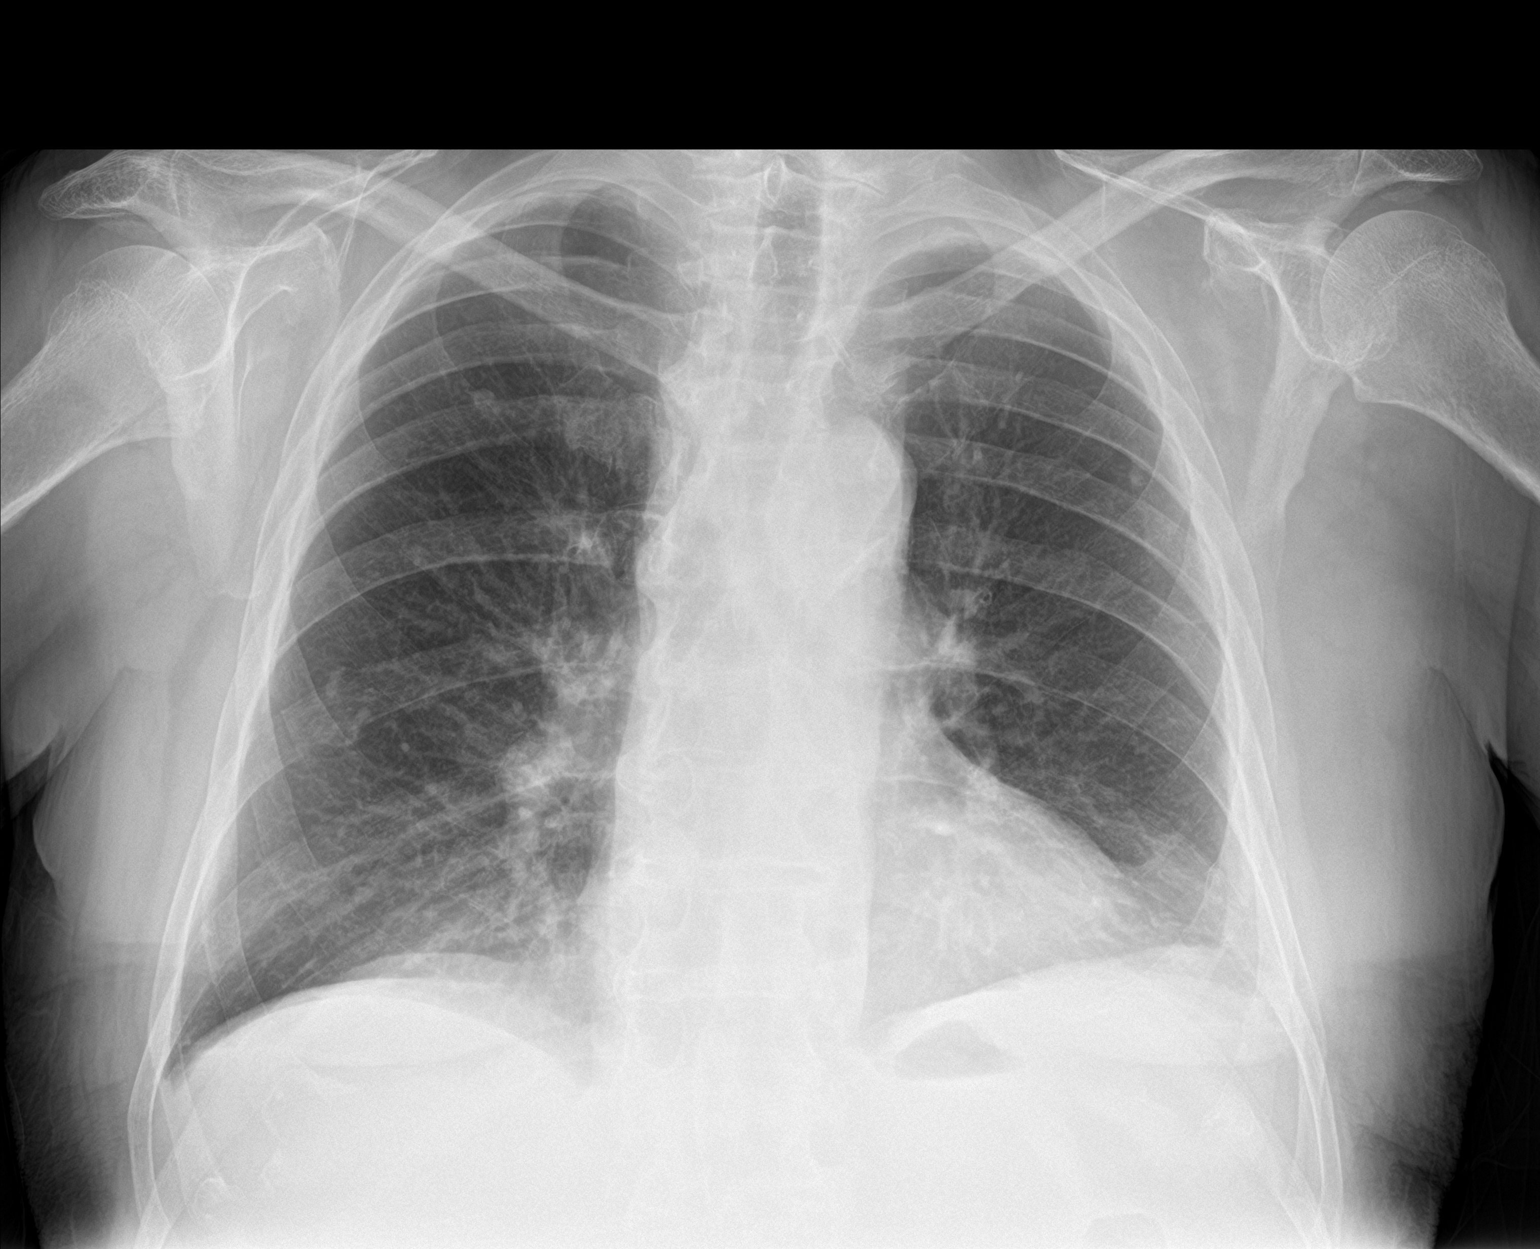

[chest lat]
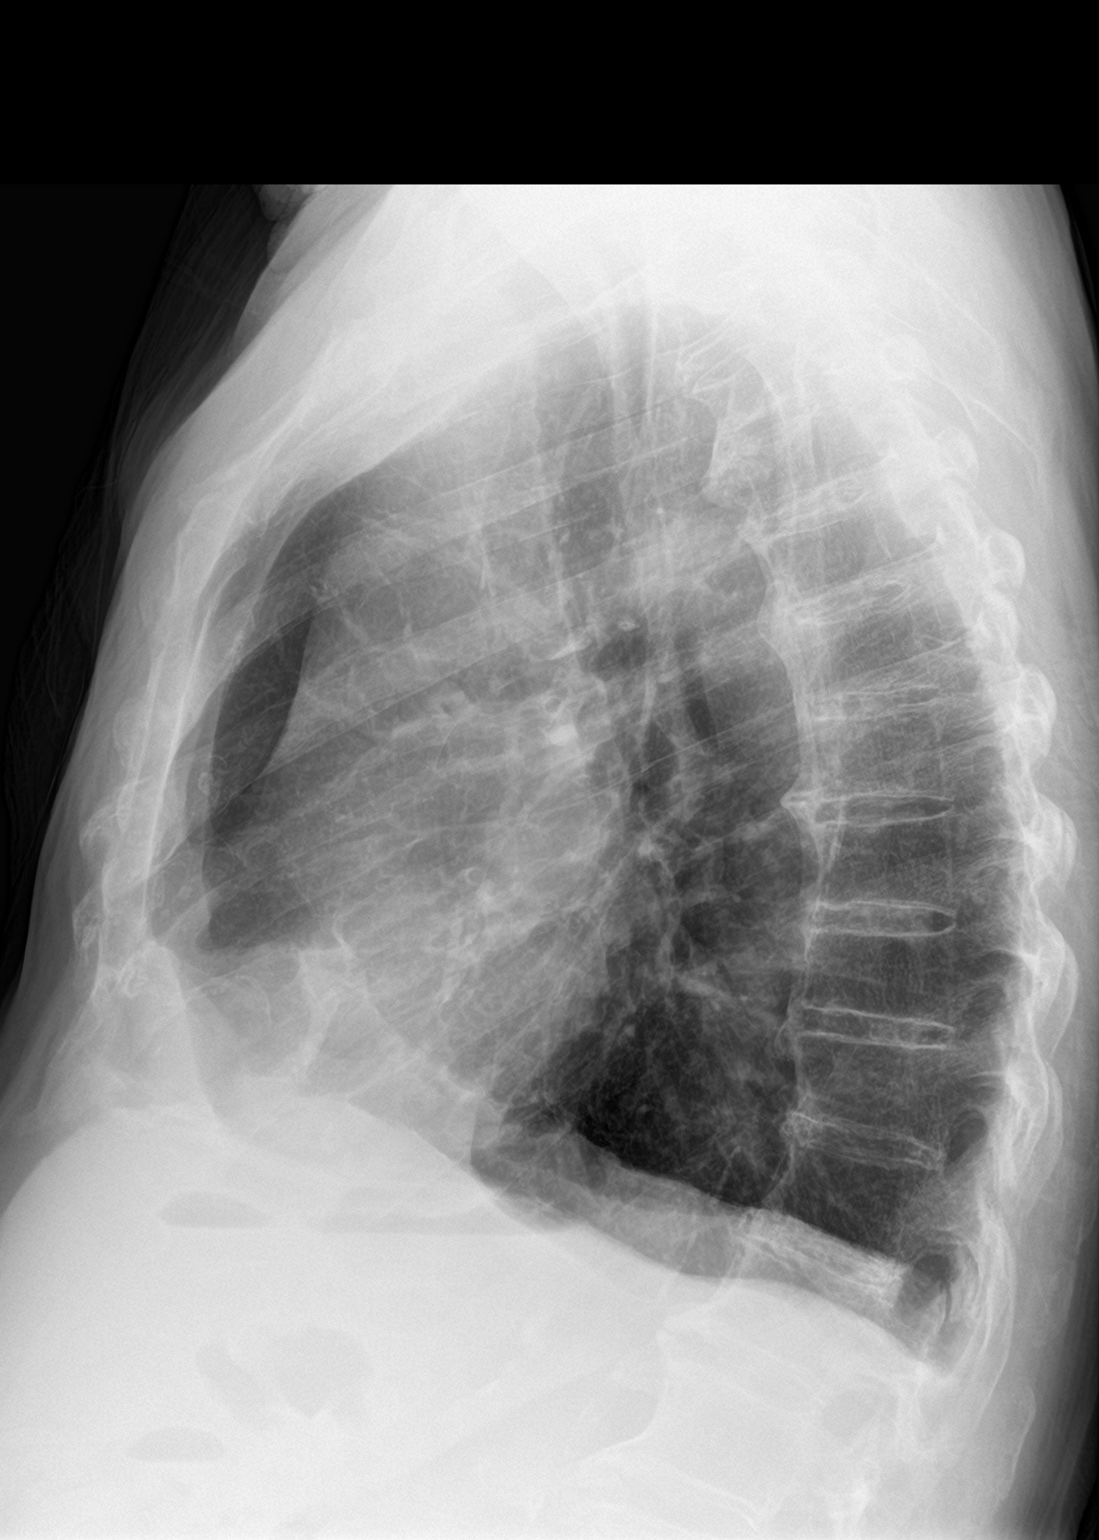

[2 of 2 positions shown; findings below may reference images not displayed]

FINDINGS: Mild chronic blunting of the left costophrenic sulcus is unchanged,
most compatible with chronic pleuroparenchymal scarring. Adjacent
chronic scarring in the lingula partially obscuring the cardiac
apex, similar to prior CT the abdomen and pelvis 07/21/2014. Right
lung is clear. No pleural effusions. Small calcified pleural plaques
noted bilaterally, similar to the prior study. No definite
suspicious appearing pulmonary nodules or masses are noted. No acute
consolidative airspace disease. No pleural effusions. No evidence of
pulmonary edema. Heart size is normal. Upper mediastinal contours
are within normal limits. Aortic atherosclerosis.
IMPRESSION: 1. No radiographic evidence of acute cardiopulmonary disease.
2. Bilateral calcified pleural plaques, similar to prior
examinations, presumably secondary to underlying asbestos related
pleural disease. Chronic scarring in the periphery of the left lung
base, similar to prior examinations.
3. Aortic atherosclerosis.

## 2018-10-26 DIAGNOSIS — C44311 Basal cell carcinoma of skin of nose: Secondary | ICD-10-CM

## 2018-10-26 DIAGNOSIS — H349 Unspecified retinal vascular occlusion: Secondary | ICD-10-CM | POA: Insufficient documentation

## 2018-10-26 HISTORY — DX: Basal cell carcinoma of skin of nose: C44.311

## 2018-10-26 HISTORY — DX: Unspecified retinal vascular occlusion: H34.9

## 2019-06-04 DIAGNOSIS — E1139 Type 2 diabetes mellitus with other diabetic ophthalmic complication: Secondary | ICD-10-CM | POA: Insufficient documentation

## 2019-06-04 DIAGNOSIS — H2510 Age-related nuclear cataract, unspecified eye: Secondary | ICD-10-CM

## 2019-06-04 HISTORY — DX: Age-related nuclear cataract, unspecified eye: H25.10

## 2019-06-04 HISTORY — DX: Type 2 diabetes mellitus with other diabetic ophthalmic complication: E11.39

## 2019-06-08 DIAGNOSIS — N401 Enlarged prostate with lower urinary tract symptoms: Secondary | ICD-10-CM

## 2019-06-08 HISTORY — DX: Benign prostatic hyperplasia with lower urinary tract symptoms: N40.1

## 2019-07-22 DIAGNOSIS — I350 Nonrheumatic aortic (valve) stenosis: Secondary | ICD-10-CM

## 2019-07-22 DIAGNOSIS — N189 Chronic kidney disease, unspecified: Secondary | ICD-10-CM

## 2019-07-22 DIAGNOSIS — I340 Nonrheumatic mitral (valve) insufficiency: Secondary | ICD-10-CM

## 2019-07-22 DIAGNOSIS — I509 Heart failure, unspecified: Secondary | ICD-10-CM

## 2019-07-22 DIAGNOSIS — I119 Hypertensive heart disease without heart failure: Secondary | ICD-10-CM

## 2019-07-22 DIAGNOSIS — R140 Abdominal distension (gaseous): Secondary | ICD-10-CM

## 2019-07-22 DIAGNOSIS — J811 Chronic pulmonary edema: Secondary | ICD-10-CM

## 2019-07-22 DIAGNOSIS — I361 Nonrheumatic tricuspid (valve) insufficiency: Secondary | ICD-10-CM

## 2019-07-22 DIAGNOSIS — I4891 Unspecified atrial fibrillation: Secondary | ICD-10-CM

## 2019-07-22 DIAGNOSIS — I351 Nonrheumatic aortic (valve) insufficiency: Secondary | ICD-10-CM

## 2019-07-22 DIAGNOSIS — R109 Unspecified abdominal pain: Secondary | ICD-10-CM

## 2019-07-22 HISTORY — DX: Abdominal distension (gaseous): R14.0

## 2019-07-23 DIAGNOSIS — R109 Unspecified abdominal pain: Secondary | ICD-10-CM | POA: Diagnosis not present

## 2019-07-23 DIAGNOSIS — I119 Hypertensive heart disease without heart failure: Secondary | ICD-10-CM | POA: Diagnosis not present

## 2019-07-23 DIAGNOSIS — I350 Nonrheumatic aortic (valve) stenosis: Secondary | ICD-10-CM | POA: Diagnosis not present

## 2019-07-23 DIAGNOSIS — I509 Heart failure, unspecified: Secondary | ICD-10-CM | POA: Diagnosis not present

## 2019-07-23 DIAGNOSIS — N189 Chronic kidney disease, unspecified: Secondary | ICD-10-CM | POA: Diagnosis not present

## 2019-07-24 DIAGNOSIS — I509 Heart failure, unspecified: Secondary | ICD-10-CM | POA: Diagnosis not present

## 2019-07-24 DIAGNOSIS — I119 Hypertensive heart disease without heart failure: Secondary | ICD-10-CM | POA: Diagnosis not present

## 2019-07-24 DIAGNOSIS — R109 Unspecified abdominal pain: Secondary | ICD-10-CM | POA: Diagnosis not present

## 2019-07-24 DIAGNOSIS — N189 Chronic kidney disease, unspecified: Secondary | ICD-10-CM | POA: Diagnosis not present

## 2019-07-24 DIAGNOSIS — I350 Nonrheumatic aortic (valve) stenosis: Secondary | ICD-10-CM | POA: Diagnosis not present

## 2019-07-25 DIAGNOSIS — R001 Bradycardia, unspecified: Secondary | ICD-10-CM

## 2019-07-25 DIAGNOSIS — R109 Unspecified abdominal pain: Secondary | ICD-10-CM | POA: Diagnosis not present

## 2019-07-25 DIAGNOSIS — I509 Heart failure, unspecified: Secondary | ICD-10-CM | POA: Diagnosis not present

## 2019-07-25 DIAGNOSIS — N189 Chronic kidney disease, unspecified: Secondary | ICD-10-CM | POA: Diagnosis not present

## 2019-07-25 DIAGNOSIS — I119 Hypertensive heart disease without heart failure: Secondary | ICD-10-CM | POA: Diagnosis not present

## 2019-07-25 DIAGNOSIS — I350 Nonrheumatic aortic (valve) stenosis: Secondary | ICD-10-CM | POA: Diagnosis not present

## 2019-07-26 DIAGNOSIS — I119 Hypertensive heart disease without heart failure: Secondary | ICD-10-CM | POA: Diagnosis not present

## 2019-07-26 DIAGNOSIS — I350 Nonrheumatic aortic (valve) stenosis: Secondary | ICD-10-CM | POA: Diagnosis not present

## 2019-07-26 DIAGNOSIS — N189 Chronic kidney disease, unspecified: Secondary | ICD-10-CM | POA: Diagnosis not present

## 2019-07-26 DIAGNOSIS — R109 Unspecified abdominal pain: Secondary | ICD-10-CM | POA: Diagnosis not present

## 2019-07-26 DIAGNOSIS — I509 Heart failure, unspecified: Secondary | ICD-10-CM | POA: Diagnosis not present

## 2019-08-03 ENCOUNTER — Ambulatory Visit: Payer: Medicare Other | Admitting: Cardiology

## 2019-08-16 ENCOUNTER — Ambulatory Visit (INDEPENDENT_AMBULATORY_CARE_PROVIDER_SITE_OTHER): Payer: Medicare Other | Admitting: Cardiology

## 2019-08-16 ENCOUNTER — Other Ambulatory Visit: Payer: Self-pay

## 2019-08-16 ENCOUNTER — Telehealth: Payer: Self-pay | Admitting: Cardiology

## 2019-08-16 ENCOUNTER — Encounter: Payer: Self-pay | Admitting: Cardiology

## 2019-08-16 VITALS — BP 102/52 | HR 88 | Ht 64.0 in | Wt 142.6 lb

## 2019-08-16 DIAGNOSIS — I10 Essential (primary) hypertension: Secondary | ICD-10-CM

## 2019-08-16 DIAGNOSIS — I509 Heart failure, unspecified: Secondary | ICD-10-CM | POA: Insufficient documentation

## 2019-08-16 DIAGNOSIS — I350 Nonrheumatic aortic (valve) stenosis: Secondary | ICD-10-CM

## 2019-08-16 DIAGNOSIS — J449 Chronic obstructive pulmonary disease, unspecified: Secondary | ICD-10-CM

## 2019-08-16 DIAGNOSIS — I5032 Chronic diastolic (congestive) heart failure: Secondary | ICD-10-CM

## 2019-08-16 HISTORY — DX: Heart failure, unspecified: I50.9

## 2019-08-16 HISTORY — DX: Nonrheumatic aortic (valve) stenosis: I35.0

## 2019-08-16 MED ORDER — FUROSEMIDE 20 MG PO TABS: 20.0000 mg | ORAL_TABLET | Freq: Every day | ORAL | 3 refills | Status: AC

## 2019-08-16 NOTE — Patient Instructions (Signed)
Medication Instructions:  Your physician recommends that you continue on your current medications as directed. Please refer to the Current Medication list given to you today. *If you need a refill on your cardiac medications before your next appointment, please call your pharmacy*  Lab Work: None ordered.  If you have labs (blood work) drawn today and your tests are completely normal, you will receive your results only by: Marland Kitchen MyChart Message (if you have MyChart) OR . A paper copy in the mail If you have any lab test that is abnormal or we need to change your treatment, we will call you to review the results.  Testing/Procedures: None ordered.  Follow-Up: At [facility], you and your health needs are our priority.  As part of our continuing mission to provide you with exceptional heart care, we have created designated Provider Care Teams.  These Care Teams include your primary Cardiologist (physician) and Advanced Practice Providers (APPs -  Physician Assistants and Nurse Practitioners) who all work together to provide you with the care you need, when you need it.  We recommend signing up for the patient portal called "MyChart".  Sign up information is provided on this After Visit Summary.  MyChart is used to connect with patients for Virtual Visits (Telemedicine).  Patients are able to view lab/test results, encounter notes, upcoming appointments, etc.  Non-urgent messages can be sent to your provider as well.   To learn more about what you can do with MyChart, go to [email or web address].    Your next appointment:   3 month(s)  The format for your next appointment:   In Person  Provider:   Jenne Campus, MD

## 2019-08-16 NOTE — Progress Notes (Signed)
Cardiology Office Note:    Date:  08/16/2019   ID:  Kevin Hines, DOB May 20, 1922, MRN [id number]  PCP:  Algis Greenhouse, MD  Cardiologist:  Jenne Campus, MD    Referring MD: Algis Greenhouse, MD   No chief complaint on file. I am doing well  History of Present Illness:    Kevin Hines is a 86 y.o. male with past medical history significant for aortic stenosis, also essential hypertension mild dementia, diabetes mellitus.  He presented to the hospital recently because of shortness of breath.  He was find to have severe aortic stenosis.  Discussion has been initiated about potentially doing TAVR.  However no decision has been reached while in the hospital.  He was sent to my office to discuss that issue.  Overall he is doing well he is hard of hearing so discussion somewhat difficult with him, however, I have his daughter with me and we talking in length about his situation.  He is living sedentary lifestyle he practically sits and lay down on the couch all day.  Walks very little.  Now after being discharged from the hospital denies having any chest pain, tightness, pressure, burning in the chest, no dizziness no passing out.  Past Medical History:  Diagnosis Date  . Alzheimer's disease (Kevin Hines)   . Benign prostatic hypertrophy   . Diabetes mellitus ([place])   . Gout   . Hypertension   . Kidney stones   . Osteoarthritis   . Osteoporosis   . Polymyalgia rheumatica ([place])   . Prostate neoplasm     Past Surgical History:  Procedure Laterality Date  . HEMORRHOID SURGERY      Current Medications: Current Meds  Medication Sig  . albuterol (PROVENTIL HFA;VENTOLIN HFA) 108 (90 Base) MCG/ACT inhaler Inhale 2 puffs into the lungs every 4 (four) hours as needed for wheezing or shortness of breath.  . bicalutamide (CASODEX) 50 MG tablet Take 50 mg by mouth daily.  . Blood Glucose Monitoring Suppl (GLUCOCOM BLOOD GLUCOSE MONITOR) DEVI 1 each by Other route as needed.  . calcium-vitamin  D (OSCAL WITH D) 500-200 MG-UNIT TABS tablet Take 1 tablet by mouth 2 (two) times daily.  . Cholecalciferol (VITAMIN D3) 10000 UNITS TABS Take 1 tablet by mouth daily.  . cyanocobalamin 1000 MCG tablet Take 1 tablet by mouth daily.  . finasteride (PROSCAR) 5 MG tablet Take 5 mg by mouth daily.  . furosemide (LASIX) 20 MG tablet Take 1 tablet (20 mg total) by mouth daily.  Marland Kitchen glucose blood (ONETOUCH VERIO) test strip 1 each by Other route 3 (three) times daily.  . insulin glargine (LANTUS) 100 UNIT/ML injection Inject 8 Units into the skin daily.  Marland Kitchen lisinopril (PRINIVIL,ZESTRIL) 40 MG tablet Take 40 mg by mouth 2 (two) times daily.  Marland Kitchen lisinopril (ZESTRIL) 20 MG tablet Take 1 tablet by mouth daily.  . polyethylene glycol powder (GLYCOLAX/MIRALAX) 17 GM/SCOOP powder Take 17 g by mouth daily.  . [DISCONTINUED] aspirin 81 MG tablet Take 81 mg by mouth daily.  . [DISCONTINUED] furosemide (LASIX) 20 MG tablet Take 20 mg by mouth daily.  . [DISCONTINUED] insulin aspart (NOVOLOG) 100 UNIT/ML injection Inject 12 Units into the skin 3 (three) times daily with meals.  . [DISCONTINUED] insulin glargine (LANTUS) 100 UNIT/ML injection Inject 22 Units into the skin daily.  . [DISCONTINUED] Omega-3 Fatty Acids (FISH OIL) 500 MG CAPS Take 1 capsule by mouth 2 (two) times daily.  . [DISCONTINUED] pravastatin (PRAVACHOL) 80 MG tablet  Take 80 mg by mouth daily.  . [DISCONTINUED] predniSONE (DELTASONE) 5 MG tablet Take 5 mg by mouth daily with breakfast. Pt reported only taking 2.5mg  daily     Allergies:   Metformin and related   Social History   Socioeconomic History  . Marital status: Married    Spouse name: Not on file  . Number of children: Not on file  . Years of education: Not on file  . Highest education level: Not on file  Occupational History  . Not on file  Tobacco Use  . Smoking status: Former Smoker    Packs/day: 0.03    Types: Cigarettes    Quit date: 05/13/1959    Years since quitting: 60.3    . Smokeless tobacco: Never Used  Substance and Sexual Activity  . Alcohol use: Not on file  . Drug use: Not on file  . Sexual activity: Not on file  Other Topics Concern  . Not on file  Social History Narrative  . Not on file   Social Determinants of Health   Financial Resource Strain:   . Difficulty of Paying Living Expenses:   Food Insecurity:   . Worried About Charity fundraiser in the Last Year:   . Arboriculturist in the Last Year:   Transportation Needs:   . Film/video editor (Medical):   Marland Kitchen Lack of Transportation (Non-Medical):   Physical Activity:   . Days of Exercise per Week:   . Minutes of Exercise per Session:   Stress:   . Feeling of Stress :   Social Connections:   . Frequency of Communication with Friends and Family:   . Frequency of Social Gatherings with Friends and Family:   . Attends Religious Services:   . Active Member of Clubs or Organizations:   . Attends Archivist Meetings:   Marland Kitchen Marital Status:      Family History: The patient's family history is not on file. ROS:   Please see the history of present illness.    All 14 point review of systems negative except as described per history of present illness  EKGs/Labs/Other Studies Reviewed:    Echocardiogram done a month ago in the hospital showed: Procedure:2D images, m-mode, color and spectral Doppler were obtained and reviewed. ECG rhythm:Sinus rhythm. Study quality:This was a technically adequate study. Left Ventricle:The left ventricular size is normal.   There is mild concentric left ventricular hypertrophy.   There is normal global left ventricular contractility.   Overall left ventricular systolic function is normal with, an EF between 55 - 60 %.   The diastolic filling pattern indicates impaired relaxation.   No regional wall motion abnormalities were noted. Right Ventricle:The right ventricle is moderately enlarged. Left Atrium:Left atrium is moderately dilated by  volume. Right Atrium:The right atrium is normal in size and function. ASD/VSD:Lipomatous Hypertrophy of the atrial septum is present Aortic Valve:Aortic valve is severely thickened and retsricted, indeterminate number of cusps.  There is mild aortic regurgitation.   The aortic pressure half-time by doppler is  654.ms.  There is severe aortic stenosis present.   Peak/mean gradient across the valve is 54mmHg / 73mmHg, AVA 0.66cm2, VR = 0.2. Mitral Valve:Moderate mitral annular calcification present.   Mild mitral regurgitation is present. Tricuspid Valve:The tricuspid valve appears structurally normal.   Mild tricuspid regurgitation present.   The right ventricular systolic pressure, as measured by Doppler, is 21mmHg. Pulmonic Valve:The pulmonic valve was not well visualized. Aorta:The aortic root, ascending  aorta and aortic arch not well visualized. [id number] inferior vena cava with normal inspiratory collapse. Pulmonary Veins:The pulmonary veins were not recorded. Pericardium:The pericardium is normal.   There is no pericardial effusion.  CONCLUSIONS ----------- 1. There is normal global left ventricular contractility. 2. Overall left ventricular systolic function is normal with, an EF between 55 - 60 %. 3. The diastolic filling pattern indicates impaired relaxation. 4. Left atrium is moderately dilated by volume. 5. Aortic valve is severely thickened and retsricted, indeterminate number of cusps. 6. There is mild aortic regurgitation. 7. There is severe aortic stenosis present. 8. Moderate mitral annular calcification present. 9. Mild mitral regurgitation is present.    Recent Labs: No results found for requested labs within last 8760 hours.  Recent Lipid Panel No results found for: CHOL, TRIG, HDL, CHOLHDL, VLDL, LDLCALC, LDLDIRECT  Physical Exam:    VS:  BP (!) 102/52   Pulse 88   Ht 5\' 4"  (1.626 m)   Wt 142 lb 9.6 oz (64.7 kg)   SpO2 99%   BMI 24.48 kg/m     Wt Readings  from Last 3 Encounters:  08/16/19 142 lb 9.6 oz (64.7 kg)  11/03/16 162 lb (73.5 kg)  11/01/15 172 lb (78 kg)     GEN:  Well nourished, well developed in no acute distress HEENT: Normal NECK: No JVD; No carotid bruits LYMPHATICS: No lymphadenopathy CARDIAC: Tones are very distant RRR, systolic ejection murmur grade 3/6 best heard right upper portion of the sternum, late peaking, no rubs, no gallops RESPIRATORY:  Clear to auscultation without rales, wheezing or rhonchi  ABDOMEN: Soft, non-tender, non-distended MUSCULOSKELETAL:  No edema; No deformity  SKIN: Warm and dry LOWER EXTREMITIES: no swelling NEUROLOGIC:  Alert and oriented x 3 PSYCHIATRIC:  Normal affect   ASSESSMENT:    1. Aortic valve stenosis, etiology of cardiac valve disease unspecified   2. Nonrheumatic aortic valve stenosis   3. Chronic diastolic congestive heart failure ([place])   4. Essential hypertension   5. Chronic obstructive pulmonary disease, unspecified COPD type ([place])    PLAN:    In order of problems listed above:  Aortic stenosis: Appears to be severe.  He is midrange 48.  He already got 1 admission to the hospital decompensated congestive heart failure.  We talked in length about prognosis, potential options for situation like this.  Option is simply medical therapy or TAVR.  We discussed pros and cons potential risk of implantation of the valve.  Patient told me he does not want to do anything about it.  He said he is doing well and he is happy the way he is.  I explained to them the typical survival to complete with critical aortic stenosis and symptoms is between 2 and 5 years, however, the fact that he was recently admitted to the hospital with congestive heart failure.  Mitral valve probably much less.  They still ready to accept that.  His blood pressure is low today I will cut down his lisinopril from 20 to only 10 mg daily.  I also told him to check blood pressure on the regular basis.  He does not and  he showed me results of his blood pressure usually acceptable. 2.  Congestive heart failure seems to be compensated today, lisinopril will be cut down because of low blood pressure.  We will maintain him on very small dose of diuretic. 3.  Essential hypertension blood pressure well controlled. 4.  COPD.  Overall gentleman with significant aortic  stenosis but because of his advanced age 44, mild degree of dementia as well as kidney dysfunction and sedentary lifestyle being comfortable we elected not to pursue TAVR.  I did review entire record from the hospital.  Medication Adjustments/Labs and Tests Ordered: Current medicines are reviewed at length with the patient today.  Concerns regarding medicines are outlined above.  Orders Placed This Encounter  Procedures  . EKG 12-Lead   Medication changes:  Meds ordered this encounter  Medications  . furosemide (LASIX) 20 MG tablet    Sig: Take 1 tablet (20 mg total) by mouth daily.    Dispense:  90 tablet    Refill:  3    Signed, Park Liter, MD, [facility] 08/16/2019 10:16 AM    [place]

## 2019-08-16 NOTE — Telephone Encounter (Signed)
New message:    Patient wife calling stating that some called. I did not see a note.

## 2019-11-28 ENCOUNTER — Other Ambulatory Visit: Payer: Self-pay

## 2019-11-28 ENCOUNTER — Encounter: Payer: Self-pay | Admitting: Cardiology

## 2019-11-28 ENCOUNTER — Ambulatory Visit (INDEPENDENT_AMBULATORY_CARE_PROVIDER_SITE_OTHER): Payer: Medicare Other | Admitting: Cardiology

## 2019-11-28 VITALS — BP 114/64 | HR 51 | Ht 64.0 in | Wt 142.2 lb

## 2019-11-28 DIAGNOSIS — I350 Nonrheumatic aortic (valve) stenosis: Secondary | ICD-10-CM

## 2019-11-28 DIAGNOSIS — J449 Chronic obstructive pulmonary disease, unspecified: Secondary | ICD-10-CM | POA: Diagnosis not present

## 2019-11-28 DIAGNOSIS — I10 Essential (primary) hypertension: Secondary | ICD-10-CM | POA: Diagnosis not present

## 2019-11-28 DIAGNOSIS — I5032 Chronic diastolic (congestive) heart failure: Secondary | ICD-10-CM

## 2019-11-28 NOTE — Patient Instructions (Signed)
Medication Instructions:  No medication changes. *If you need a refill on your cardiac medications before your next appointment, please call your pharmacy*   Lab Work: None ordered If you have labs (blood work) drawn today and your tests are completely normal, you will receive your results only by: . MyChart Message (if you have MyChart) OR . A paper copy in the mail If you have any lab test that is abnormal or we need to change your treatment, we will call you to review the results.   Testing/Procedures: None ordered   Follow-Up: At [facility], you and your health needs are our priority.  As part of our continuing mission to provide you with exceptional heart care, we have created designated Provider Care Teams.  These Care Teams include your primary Cardiologist (physician) and Advanced Practice Providers (APPs -  Physician Assistants and Nurse Practitioners) who all work together to provide you with the care you need, when you need it.  We recommend signing up for the patient portal called "MyChart".  Sign up information is provided on this After Visit Summary.  MyChart is used to connect with patients for Virtual Visits (Telemedicine).  Patients are able to view lab/test results, encounter notes, upcoming appointments, etc.  Non-urgent messages can be sent to your provider as well.   To learn more about what you can do with MyChart, go to [email or web address].    Your next appointment:   6 month(s)  The format for your next appointment:   In Person  Provider:   Robert Krasowski, MD   Other Instructions NA  

## 2019-11-28 NOTE — Progress Notes (Signed)
Cardiology Office Note:    Date:  11/28/2019   ID:  Kevin Hines, DOB 1922-09-23, MRN [id number]  PCP:  Algis Greenhouse, MD  Cardiologist:  Jenne Campus, MD    Referring MD: Algis Greenhouse, MD   No chief complaint on file. Doing well  History of Present Illness:    Kevin Hines is a 86 y.o. male with significant aortic stenosis, however, because of his advanced age and comorbidities decision has been made for medical therapy.  Try to distract his wife of 75 years past just few weeks ago.  She was 89 of course he is sad about that.  He is living with his daughter right now.  She said that he is doing well he is walking around a little bit he is playing with his grandchildren.  Denies having any chest pain tightness squeezing pressure burning chest no shortness of breath no swelling of lower extremities.  Overall considering his condition I think he is doing well.  Past Medical History:  Diagnosis Date  . Alzheimer's disease ([place])   . Benign prostatic hypertrophy   . Diabetes mellitus ([place])   . Gout   . Hypertension   . Kidney stones   . Osteoarthritis   . Osteoporosis   . Polymyalgia rheumatica ([place])   . Prostate neoplasm     Past Surgical History:  Procedure Laterality Date  . HEMORRHOID SURGERY      Current Medications: Current Meds  Medication Sig  . albuterol (PROVENTIL HFA;VENTOLIN HFA) 108 (90 Base) MCG/ACT inhaler Inhale 2 puffs into the lungs every 4 (four) hours as needed for wheezing or shortness of breath.  . bicalutamide (CASODEX) 50 MG tablet Take 50 mg by mouth daily.  . Blood Glucose Monitoring Suppl (GLUCOCOM BLOOD GLUCOSE MONITOR) DEVI 1 each by Other route as needed.  . calcium-vitamin D (OSCAL WITH D) 500-200 MG-UNIT TABS tablet Take 1 tablet by mouth 2 (two) times daily.  . Cholecalciferol (VITAMIN D3) 50 MCG (2000 UT) capsule Take 1 tablet by mouth daily.   . cyanocobalamin 1000 MCG tablet Take 1 tablet by mouth daily.  . finasteride  (PROSCAR) 5 MG tablet Take 5 mg by mouth daily.  . furosemide (LASIX) 20 MG tablet Take 1 tablet (20 mg total) by mouth daily.  Marland Kitchen glucose blood (ONETOUCH VERIO) test strip 1 each by Other route 3 (three) times daily.  . insulin glargine (LANTUS) 100 UNIT/ML injection Inject 8 Units into the skin daily.  Marland Kitchen lisinopril (ZESTRIL) 20 MG tablet Take 10 mg by mouth daily.  . polyethylene glycol powder (GLYCOLAX/MIRALAX) 17 GM/SCOOP powder Take 17 g by mouth daily.     Allergies:   Galantamine, Metformin and related, Simvastatin, and Irbesartan   Social History   Socioeconomic History  . Marital status: Married    Spouse name: Not on file  . Number of children: Not on file  . Years of education: Not on file  . Highest education level: Not on file  Occupational History  . Not on file  Tobacco Use  . Smoking status: Former Smoker    Packs/day: 0.03    Types: Cigarettes    Quit date: 05/13/1959    Years since quitting: 60.5  . Smokeless tobacco: Never Used  Substance and Sexual Activity  . Alcohol use: Not on file  . Drug use: Not on file  . Sexual activity: Not on file  Other Topics Concern  . Not on file  Social History Narrative  .  Not on file   Social Determinants of Health   Financial Resource Strain:   . Difficulty of Paying Living Expenses:   Food Insecurity:   . Worried About Charity fundraiser in the Last Year:   . Arboriculturist in the Last Year:   Transportation Needs:   . Film/video editor (Medical):   Marland Kitchen Lack of Transportation (Non-Medical):   Physical Activity:   . Days of Exercise per Week:   . Minutes of Exercise per Session:   Stress:   . Feeling of Stress :   Social Connections:   . Frequency of Communication with Friends and Family:   . Frequency of Social Gatherings with Friends and Family:   . Attends Religious Services:   . Active Member of Clubs or Organizations:   . Attends Archivist Meetings:   Marland Kitchen Marital Status:      Family  History: The patient's family history is not on file. ROS:   Please see the history of present illness.    All 14 point review of systems negative except as described per history of present illness  EKGs/Labs/Other Studies Reviewed:      Recent Labs: No results found for requested labs within last 8760 hours.  Recent Lipid Panel No results found for: CHOL, TRIG, HDL, CHOLHDL, VLDL, LDLCALC, LDLDIRECT  Physical Exam:    VS:  BP 114/64   Pulse (!) 51   Ht 5\' 4"  (1.626 m)   Wt 142 lb 3.2 oz (64.5 kg)   SpO2 95%   BMI 24.41 kg/m     Wt Readings from Last 3 Encounters:  11/28/19 142 lb 3.2 oz (64.5 kg)  08/16/19 142 lb 9.6 oz (64.7 kg)  11/03/16 162 lb (73.5 kg)     GEN:  Well nourished, well developed in no acute distress HEENT: Normal NECK: No JVD; No carotid bruits LYMPHATICS: No lymphadenopathy CARDIAC: RRR, systolic ejection murmur grade 2/6 best heard right upper portion of the sternum, no rubs, no gallops RESPIRATORY:  Clear to auscultation without rales, wheezing or rhonchi  ABDOMEN: Soft, non-tender, non-distended MUSCULOSKELETAL:  No edema; No deformity  SKIN: Warm and dry LOWER EXTREMITIES: no swelling NEUROLOGIC:  Alert and oriented x 3 PSYCHIATRIC:  Normal affect   ASSESSMENT:    1. Nonrheumatic aortic valve stenosis   2. Chronic diastolic congestive heart failure ([place])   3. Essential hypertension   4. Chronic obstructive pulmonary disease, unspecified COPD type ([place])    PLAN:    In order of problems listed above:  1. Aortic stenosis which is significant but because of comorbid to the medical therapy.  Medication seems to be appropriate which I will continue.  His blood pressure seems to be reasonable. 2. Chronic diastolic congestive heart failure: Stable. 3. Essential hypertension: Blood pressure well controlled. 4. COPD: Noted, stable.   Medication Adjustments/Labs and Tests Ordered: Current medicines are reviewed at length with the patient  today.  Concerns regarding medicines are outlined above.  No orders of the defined types were placed in this encounter.  Medication changes: No orders of the defined types were placed in this encounter.   Signed, Park Liter, MD, [facility] 11/28/2019 10:55 AM    [place]

## 2020-05-30 ENCOUNTER — Other Ambulatory Visit: Payer: Self-pay

## 2020-05-30 ENCOUNTER — Ambulatory Visit (INDEPENDENT_AMBULATORY_CARE_PROVIDER_SITE_OTHER): Payer: Medicare Other | Admitting: Cardiology

## 2020-05-30 ENCOUNTER — Encounter: Payer: Self-pay | Admitting: Cardiology

## 2020-05-30 ENCOUNTER — Telehealth: Payer: Self-pay

## 2020-05-30 VITALS — BP 158/64 | HR 77 | Ht 65.0 in | Wt 143.4 lb

## 2020-05-30 DIAGNOSIS — I10 Essential (primary) hypertension: Secondary | ICD-10-CM | POA: Diagnosis not present

## 2020-05-30 DIAGNOSIS — I350 Nonrheumatic aortic (valve) stenosis: Secondary | ICD-10-CM

## 2020-05-30 DIAGNOSIS — R0789 Other chest pain: Secondary | ICD-10-CM | POA: Insufficient documentation

## 2020-05-30 HISTORY — DX: Other chest pain: R07.89

## 2020-05-30 MED ORDER — ASPIRIN EC 81 MG PO TBEC: 81.0000 mg | DELAYED_RELEASE_TABLET | Freq: Every day | ORAL | 3 refills | Status: AC

## 2020-05-30 MED ORDER — LISINOPRIL 20 MG PO TABS: 20.0000 mg | ORAL_TABLET | Freq: Every day | ORAL | 3 refills | Status: DC

## 2020-05-30 MED ORDER — RANOLAZINE ER 500 MG PO TB12: 500.0000 mg | ORAL_TABLET | Freq: Two times a day (BID) | ORAL | 6 refills | Status: DC

## 2020-05-30 MED ORDER — PANTOPRAZOLE SODIUM 40 MG PO TBEC: 40.0000 mg | DELAYED_RELEASE_TABLET | Freq: Every day | ORAL | 11 refills | Status: AC

## 2020-05-30 NOTE — Progress Notes (Signed)
Cardiology Office Note:    Date:  05/30/2020   ID:  Kevin Hines, DOB 1923/03/12, MRN [id number]  PCP:  Algis Greenhouse, MD  Cardiologist:  Jenean Lindau, MD   Referring MD: Algis Greenhouse, MD    ASSESSMENT:    1. Aortic valve stenosis, etiology of cardiac valve disease unspecified   2. Essential hypertension, benign   3. Chest discomfort    PLAN:    In order of problems listed above:  1. Chest discomfort: Patient's symptoms are vague and difficult to understand.  He is a poor historian.  His daughter accompanies him for this visit.  I would not prefer to give him medication such as isosorbide or a medication like Norvasc because of severe aortic stenosis and the fact that it may make him hyper tensive and give adverse issues.  Therefore I have increased his lisinopril by 10 mg.  I will add Ranexa 500 mg twice daily.  I also would like to give him medications for acid reflux just in case this is acid reflux issues.  I have asked him to take a coated baby aspirin on a daily basis just in case there is an element of coronary artery disease.  I am not sure whether he is a candidate for any further evaluation with stress testing or any such issues.  He is a frail gentleman.  There are issues with dementia.  I discussed medical management with the daughter who is very supportive and she is agreeable.  They will do a virtual follow-up visit with Dr. Agustin Cree in 2 weeks.  He knows to go to the nearest emergency room for any concerning symptoms. 2. Essential hypertension: As mentioned above blood pressure is elevated and home readings are elevated so I increase lisinopril by 10 more milligrams every day. 3. Further recommendations will be made based on the findings of the follow-up with Dr. Agustin Cree in 2 weeks.   Medication Adjustments/Labs and Tests Ordered: Current medicines are reviewed at length with the patient today.  Concerns regarding medicines are outlined above.  No orders of  the defined types were placed in this encounter.  No orders of the defined types were placed in this encounter.    No chief complaint on file.    History of Present Illness:    Kevin Hines is a 87 y.o. male.  Patient has past medical history of severe aortic stenosis, essential hypertension.  He is daughter is brought him in today.  He sees Dr. Agustin Cree routinely.  The patient mentions to me that he has some substernal discomfort more like acid reflux issues.  No chest pain orthopnea or PND.  His symptoms are vague.  He ambulates age appropriately in which does not watch.  Past Medical History:  Diagnosis Date  . Alzheimer's disease ([place])   . Benign prostatic hypertrophy   . Diabetes mellitus ([place])   . Gout   . Hypertension   . Kidney stones   . Osteoarthritis   . Osteoporosis   . Polymyalgia rheumatica ([place])   . Prostate neoplasm     Past Surgical History:  Procedure Laterality Date  . HEMORRHOID SURGERY      Current Medications: Current Meds  Medication Sig  . albuterol (PROVENTIL HFA;VENTOLIN HFA) 108 (90 Base) MCG/ACT inhaler Inhale 2 puffs into the lungs every 4 (four) hours as needed for wheezing or shortness of breath.  . bicalutamide (CASODEX) 50 MG tablet Take 50 mg by mouth daily.  . Blood  Glucose Monitoring Suppl (GLUCOCOM BLOOD GLUCOSE MONITOR) DEVI 1 each by Other route as needed.  . calcium-vitamin D (OSCAL WITH D) 500-200 MG-UNIT TABS tablet Take 1 tablet by mouth 2 (two) times daily.  . Cholecalciferol (VITAMIN D3) 50 MCG (2000 UT) capsule Take 1 tablet by mouth daily.   . cyanocobalamin 1000 MCG tablet Take 1 tablet by mouth daily.  . finasteride (PROSCAR) 5 MG tablet Take 5 mg by mouth daily.  . furosemide (LASIX) 20 MG tablet Take 1 tablet (20 mg total) by mouth daily.  Marland Kitchen glucose blood (ONETOUCH VERIO) test strip 1 each by Other route 3 (three) times daily.  . insulin glargine (LANTUS) 100 UNIT/ML injection Inject 8 Units into the skin daily.  Marland Kitchen  lisinopril (ZESTRIL) 20 MG tablet Take 10 mg by mouth daily.  . polyethylene glycol powder (GLYCOLAX/MIRALAX) 17 GM/SCOOP powder Take 17 g by mouth daily.     Allergies:   Galantamine, Metformin and related, Simvastatin, and Irbesartan   Social History   Socioeconomic History  . Marital status: Married    Spouse name: Not on file  . Number of children: Not on file  . Years of education: Not on file  . Highest education level: Not on file  Occupational History  . Not on file  Tobacco Use  . Smoking status: Former Smoker    Packs/day: 0.03    Types: Cigarettes    Quit date: 05/13/1959    Years since quitting: 61.0  . Smokeless tobacco: Never Used  Substance and Sexual Activity  . Alcohol use: Not on file  . Drug use: Not on file  . Sexual activity: Not on file  Other Topics Concern  . Not on file  Social History Narrative  . Not on file   Social Determinants of Health   Financial Resource Strain: Not on file  Food Insecurity: Not on file  Transportation Needs: Not on file  Physical Activity: Not on file  Stress: Not on file  Social Connections: Not on file     Family History: The patient's family history includes Leukemia in his father. There is no history of Hypertension, Diabetes, Heart disease, or Cancer.  ROS:   Please see the history of present illness.    All other systems reviewed and are negative.  EKGs/Labs/Other Studies Reviewed:    The following studies were reviewed today: EKG reveals sinus rhythm bradycardia nonspecific ST-T changes.   Recent Labs: No results found for requested labs within last 8760 hours.  Recent Lipid Panel No results found for: CHOL, TRIG, HDL, CHOLHDL, VLDL, LDLCALC, LDLDIRECT  Physical Exam:    VS:  BP (!) 158/64   Pulse 77   Ht 5\' 5"  (1.651 m)   Wt 143 lb 6.4 oz (65 kg)   SpO2 98%   BMI 23.86 kg/m     Wt Readings from Last 3 Encounters:  05/30/20 143 lb 6.4 oz (65 kg)  11/28/19 142 lb 3.2 oz (64.5 kg)  08/16/19  142 lb 9.6 oz (64.7 kg)     GEN: Patient is in no acute distress HEENT: Normal NECK: No JVD; No carotid bruits LYMPHATICS: No lymphadenopathy CARDIAC: Hear sounds regular, 2/6 systolic murmur at the apex. RESPIRATORY:  Clear to auscultation without rales, wheezing or rhonchi  ABDOMEN: Soft, non-tender, non-distended MUSCULOSKELETAL:  No edema; No deformity  SKIN: Warm and dry NEUROLOGIC:  Alert and oriented x 3 PSYCHIATRIC:  Normal affect   Signed, Jenean Lindau, MD  05/30/2020 11:18 AM  [facility]

## 2020-05-30 NOTE — Patient Instructions (Addendum)
Medication Instructions:  Your physician has recommended you make the following change in your medication:   Increase your Lisinopril to 20 mg daily. Take 81 mg coated aspirin daily. Take Ranexa 500 mg twice daily. Take pantoprazole 40 mg daily.  *If you need a refill on your cardiac medications before your next appointment, please call your pharmacy*   Lab Work: None ordered If you have labs (blood work) drawn today and your tests are completely normal, you will receive your results only by: Marland Kitchen MyChart Message (if you have MyChart) OR . A paper copy in the mail If you have any lab test that is abnormal or we need to change your treatment, we will call you to review the results.   Testing/Procedures: None ordered   Follow-Up: At [facility], you and your health needs are our priority.  As part of our continuing mission to provide you with exceptional heart care, we have created designated Provider Care Teams.  These Care Teams include your primary Cardiologist (physician) and Advanced Practice Providers (APPs -  Physician Assistants and Nurse Practitioners) who all work together to provide you with the care you need, when you need it.  We recommend signing up for the patient portal called "MyChart".  Sign up information is provided on this After Visit Summary.  MyChart is used to connect with patients for Virtual Visits (Telemedicine).  Patients are able to view lab/test results, encounter notes, upcoming appointments, etc.  Non-urgent messages can be sent to your provider as well.   To learn more about what you can do with MyChart, go to [email or web address].    Your next appointment:   2 week(s)  The format for your next appointment:   Virtual Visit   Provider:   Jenne Campus, MD   Other Instructions  Aspirin and Your Heart Aspirin is a medicine that prevents the platelets in your blood from sticking together. Platelets are the cells that your blood uses for  clotting. Aspirin can be used to help reduce the risk of blood clots, heart attacks, and other heart-related problems. What are the risks? Daily use of aspirin can cause side effects. Some of these include:  Bleeding. Bleeding can be minor or serious. An example of minor bleeding is bleeding from a cut, and the bleeding does not stop. An example of more serious bleeding is stomach bleeding or, rarely, bleeding into the brain. Your risk of bleeding increases if you are also taking NSAIDs, such as ibuprofen.  Increased bruising.  Upset stomach.  An allergic reaction. People who have growths inside the nose (nasal polyps) have an increased risk of developing an aspirin allergy. How to use aspirin to care for your heart  Take aspirin only as told by your health care provider. Make sure that you understand how much to take and what form to take. The two forms of aspirin are: ? Non-enteric-coated.This type of aspirin does not have a coating and is absorbed quickly. This type of aspirin also comes in a chewable form. ? Enteric-coated. This type of aspirin has a coating that releases the medicine very slowly. Enteric-coated aspirin might cause less stomach upset than non-enteric-coated aspirin. This type of aspirin should not be chewed or crushed.  Work with your health care provider to find out whether it is safe and beneficial for you to take aspirin daily. Taking aspirin daily may be helpful if: ? You have had a heart attack or chest pain, or you are at risk for a heart attack. ?  You have a condition in which certain heart vessels are blocked (coronary artery disease), and you have had a procedure to treat it. Examples are:  Open-heart surgery, such as coronary artery bypass surgery (CABG).  Coronary angioplasty,which is done to widen a blood vessel of your heart.  Having a small mesh tube, or stent, placed in your coronary artery. ? You have had certain types of stroke or a mini-stroke known as  a transient ischemic attack (TIA). ? You have a narrowing of the arteries that supply the limbs (peripheral artery disease, or PAD). ? You have long-term (chronic) heart rhythm problems, such as atrial fibrillation, and your health care provider thinks aspirin may help. ? You have valve disease or have had surgery on a valve. ? You are considered at increased risk of developing coronary artery disease or PAD.   Follow these instructions at home Medicines  Take over-the-counter and prescription medicines only as told by your health care provider.  If you are taking blood thinners: ? Talk with your health care provider before you take any medicines that contain aspirin or NSAIDs, such as ibuprofen. These medicines increase your risk for dangerous bleeding. ? Take your medicine exactly as told, at the same time every day. ? Avoid activities that could cause injury or bruising, and follow instructions about how to prevent falls. ? Wear a medical alert bracelet or carry a card that lists what medicines you take. General instructions  Do not drink alcohol if: ? Your health care provider tells you not to drink. ? You are pregnant, may be pregnant, or are planning to become pregnant.  If you drink alcohol: ? Limit how much you use to:  0-1 drink a day for women.  0-2 drinks a day for men. ? Be aware of how much alcohol is in your drink. In the [place] one drink equals one 12 oz bottle of beer (355 mL), one 5 oz glass of wine (148 mL), or one 1 oz glass of hard liquor (44 mL).  Keep all follow-up visits as told by your health care provider. This is important. Where to find more information  The American Heart Association: www.heart.org Contact a health care provider if you have:  Unusual bleeding or bruising.  Stomach pain or nausea.  Ringing in your ears.  An allergic reaction that causes hives, itchy skin, or swelling of the lips, tongue, or face. Get help right away if:  You  notice that your bowel movements are bloody, or dark red or black in color.  You vomit or cough up blood.  You have blood in your urine.  You cough, breathe loudly (wheeze), or feel short of breath.  You have chest pain, especially if the pain spreads to your arms, back, neck, or jaw.  You have a headache with confusion. You have any symptoms of a stroke. "BE FAST" is an easy way to remember the main warning signs of a stroke:  B - Balance. Signs are dizziness, sudden trouble walking, or loss of balance.  E - Eyes. Signs are trouble seeing or a sudden change in vision.  F - Face. Signs are sudden weakness or numbness of the face, or the face or eyelid drooping on one side.  A - Arms. Signs are weakness or numbness in an arm. This happens suddenly and usually on one side of the body.  S - Speech. Signs are sudden trouble speaking, slurred speech, or trouble understanding what people say.  T - Time.  Time to call emergency services. Write down what time symptoms started. You have other signs of a stroke, such as:  A sudden, severe headache with no known cause.  Nausea or vomiting.  Seizure. These symptoms may represent a serious problem that is an emergency. Do not wait to see if the symptoms will go away. Get medical help right away. Call your local emergency services (911 in the [place] Do not drive yourself to the hospital. Summary  Aspirin use can help reduce the risk of blood clots, heart attacks, and other heart-related problems.  Daily use of aspirin can cause side effects.  Take aspirin only as told by your health care provider. Make sure that you understand how much to take and what form to take.  Your health care provider will help you determine whether it is safe and beneficial for you to take aspirin daily. This information is not intended to replace advice given to you by your health care provider. Make sure you discuss any questions you have with your health care  provider. Document Revised: 01/31/2019 Document Reviewed: 01/31/2019 Elsevier Patient Education  [place].  Ranolazine tablets, extended release What is this medicine? RANOLAZINE (ra NOE la zeen) is a heart medicine. It is used to treat chronic chest pain (angina). This medicine must be taken regularly. It will not relieve an acute episode of chest pain. This medicine may be used for other purposes; ask your health care provider or pharmacist if you have questions. COMMON BRAND NAME(S): Ranexa What should I tell my health care provider before I take this medicine? They need to know if you have any of these conditions:  heart disease  irregular heartbeat  kidney disease  liver disease  low levels of potassium or magnesium in the blood  an unusual or allergic reaction to ranolazine, other medicines, foods, dyes, or preservatives  pregnant or trying to get pregnant  breast-feeding How should I use this medicine? Take this medicine by mouth with a glass of water. Follow the directions on the prescription label. Do not cut, crush, or chew this medicine. Take with or without food. Do not take this medication with grapefruit juice. Take your doses at regular intervals. Do not take your medicine more often then directed. Talk to your pediatrician regarding the use of this medicine in children. Special care may be needed. Overdosage: If you think you have taken too much of this medicine contact a poison control center or emergency room at once. NOTE: This medicine is only for you. Do not share this medicine with others. What if I miss a dose? If you miss a dose, take it as soon as you can. If it is almost time for your next dose, take only that dose. Do not take double or extra doses. What may interact with this medicine? Do not take this medicine with any of the following medications:  antivirals for HIV or AIDS  cerivastatin  certain antibiotics like chloramphenicol,  clarithromycin, dalfopristin; quinupristin, isoniazid, rifabutin, rifampin, rifapentine  certain medicines used for cancer like imatinib, nilotinib  certain medicines for fungal infections like fluconazole, itraconazole, ketoconazole, posaconazole, voriconazole  certain medicines for irregular heart beat like dronedarone  certain medicines for seizures like carbamazepine, fosphenytoin, oxcarbazepine, phenobarbital, phenytoin  cisapride  conivaptan  cyclosporine  grapefruit or grapefruit juice  lumacaftor; ivacaftor  nefazodone  pimozide  quinacrine  St John's wort  thioridazine This medicine may also interact with the following medications:  alfuzosin  certain medicines for depression,  anxiety, or psychotic disturbances like bupropion, citalopram, fluoxetine, fluphenazine, paroxetine, perphenazine, risperidone, sertraline, trifluoperazine  certain medicines for cholesterol like atorvastatin, lovastatin, simvastatin  certain medicines for stomach problems like octreotide, palonosetron, prochlorperazine  eplerenone  ergot alkaloids like dihydroergotamine, ergonovine, ergotamine, methylergonovine  metformin  nicardipine  other medicines that prolong the QT interval (cause an abnormal heart rhythm) like dofetilide, ziprasidone  sirolimus  tacrolimus This list may not describe all possible interactions. Give your health care provider a list of all the medicines, herbs, non-prescription drugs, or dietary supplements you use. Also tell them if you smoke, drink alcohol, or use illegal drugs. Some items may interact with your medicine. What should I watch for while using this medicine? Visit your doctor for regular check ups. Tell your doctor or healthcare professional if your symptoms do not start to get better or if they get worse. This medicine will not relieve an acute attack of angina or chest pain. This medicine can change your heart rhythm. Your health care  provider may check your heart rhythm by ordering an electrocardiogram (ECG) while you are taking this medicine. You may get drowsy or dizzy. Do not drive, use machinery, or do anything that needs mental alertness until you know how this medicine affects you. Do not stand or sit up quickly, especially if you are an older patient. This reduces the risk of dizzy or fainting spells. Alcohol may interfere with the effect of this medicine. Avoid alcoholic drinks. If you are scheduled for any medical or dental procedure, tell your healthcare provider that you are taking this medicine. This medicine can interact with other medicines used during surgery. What side effects may I notice from receiving this medicine? Side effects that you should report to your doctor or health care professional as soon as possible:  allergic reactions like skin rash, itching or hives, swelling of the face, lips, or tongue  breathing problems  changes in vision  fast, irregular or pounding heartbeat  feeling faint or lightheaded, falls  low or high blood pressure  numbness or tingling feelings  ringing in the ears  tremor or shakiness  slow heartbeat (fewer than 50 beats per minute)  swelling of the legs or feet Side effects that usually do not require medical attention (report to your doctor or health care professional if they continue or are bothersome):  constipation  drowsy  dry mouth  headache  nausea or vomiting  stomach upset This list may not describe all possible side effects. Call your doctor for medical advice about side effects. You may report side effects to FDA at 1-800-FDA-1088. Where should I keep my medicine? Keep out of the reach of children. Store at room temperature between 15 and 30 degrees C (59 and 86 degrees F). Throw away any unused medicine after the expiration date. NOTE: This sheet is a summary. It may not cover all possible information. If you have questions about this  medicine, talk to your doctor, pharmacist, or health care provider.  2021 Elsevier/Gold Standard (2018-04-20 09:18:49)

## 2020-05-30 NOTE — Telephone Encounter (Signed)
Spoke to the patients son and daughter just now and they let me know that they were calling in to speak with the Dr. In regards to the patient having heartburn. The patient is not having any cardiac issues. Denies chest pain. I advised that for heartburn medication the patient will need to contact his PCP. They verbalize understanding and thank me for the call back.

## 2020-06-01 ENCOUNTER — Encounter: Payer: Self-pay | Admitting: Cardiology

## 2020-06-01 ENCOUNTER — Ambulatory Visit (INDEPENDENT_AMBULATORY_CARE_PROVIDER_SITE_OTHER): Payer: Medicare Other | Admitting: Cardiology

## 2020-06-01 ENCOUNTER — Other Ambulatory Visit: Payer: Self-pay

## 2020-06-01 VITALS — BP 144/72 | HR 113 | Ht 65.0 in | Wt 145.0 lb

## 2020-06-01 DIAGNOSIS — I350 Nonrheumatic aortic (valve) stenosis: Secondary | ICD-10-CM | POA: Diagnosis not present

## 2020-06-01 DIAGNOSIS — R0789 Other chest pain: Secondary | ICD-10-CM | POA: Diagnosis not present

## 2020-06-01 DIAGNOSIS — I10 Essential (primary) hypertension: Secondary | ICD-10-CM | POA: Diagnosis not present

## 2020-06-01 MED ORDER — RANOLAZINE ER 1000 MG PO TB12: 1000.0000 mg | ORAL_TABLET | Freq: Two times a day (BID) | ORAL | 3 refills | Status: DC

## 2020-06-01 NOTE — Patient Instructions (Signed)
Medication Instructions:  Your physician has recommended you make the following change in your medication:   Increase your Ranexa (Ranolizine) to 1000 mg twice daily. Try over the counter Maalox.  *If you need a refill on your cardiac medications before your next appointment, please call your pharmacy*   Lab Work: None ordered If you have labs (blood work) drawn today and your tests are completely normal, you will receive your results only by:  [place] (if you have MyChart) OR  A paper copy in the mail If you have any lab test that is abnormal or we need to change your treatment, we will call you to review the results.   Testing/Procedures: None ordered   Follow-Up: At [facility], you and your health needs are our priority.  As part of our continuing mission to provide you with exceptional heart care, we have created designated Provider Care Teams.  These Care Teams include your primary Cardiologist (physician) and Advanced Practice Providers (APPs -  Physician Assistants and Nurse Practitioners) who all work together to provide you with the care you need, when you need it.  We recommend signing up for the patient portal called "MyChart".  Sign up information is provided on this After Visit Summary.  MyChart is used to connect with patients for Virtual Visits (Telemedicine).  Patients are able to view lab/test results, encounter notes, upcoming appointments, etc.  Non-urgent messages can be sent to your provider as well.   To learn more about what you can do with MyChart, go to [email or web address].    Your next appointment:   3 week(s)  The format for your next appointment:   In Person  Provider:   Jenne Campus, MD   Other Instructions NA

## 2020-06-01 NOTE — Progress Notes (Signed)
Cardiology Office Note:    Date:  06/01/2020   ID:  Kevin Hines, DOB 1922-06-11, MRN [id number]  PCP:  Algis Greenhouse, MD  Cardiologist:  Jenne Campus, MD    Referring MD: Algis Greenhouse, MD   Chief Complaint  Patient presents with  . Chest Pain    History of Present Illness:    Kevin Hines is a 87 y.o. male with past medical history significant for aortic stenosis which is significant but because of his advanced age comorbidity as well as fragile condition decision has been made to continue medical therapy.  Recently he started complaining of having chest pain.  He described pain in the epigastrium burning-like sensation he did see my partner just few days ago who very appropriately give him proton pump inhibitor as well as ranolazine.  He said he is feeling slightly better but overall still complaining of having some epigastric pain.  He tells me that he take some baking soda and that almost immediately relieved the pain suggesting that this is may be stomach issue I spoke to him as well as to his daughter who was present during the visit and I told him that he does have critical aortic stenosis which could be responsible for his symptomatology but because of his advanced age we not going to do any intervention I also explained to them have difficulty still trying to get his symptoms under control with such a significant aortic stenosis what I do for today I will asked him to get Maalox and Mylanta and try diet I will increase dose of ranolazine I see him back in about 2 to 3 weeks and at that time if he still symptomatic we may be forced to reach for some more dangerous medication may be even beta-blocker.  We will see how situation evolves.  Past Medical History:  Diagnosis Date  . Alzheimer's disease ([place])   . Benign prostatic hypertrophy   . Diabetes mellitus ([place])   . Gout   . Hypertension   . Kidney stones   . Osteoarthritis   . Osteoporosis   . Polymyalgia  rheumatica ([place])   . Prostate neoplasm     Past Surgical History:  Procedure Laterality Date  . HEMORRHOID SURGERY      Current Medications: Current Meds  Medication Sig  . albuterol (PROVENTIL HFA;VENTOLIN HFA) 108 (90 Base) MCG/ACT inhaler Inhale 2 puffs into the lungs every 4 (four) hours as needed for wheezing or shortness of breath.  Marland Kitchen aspirin EC 81 MG tablet Take 1 tablet (81 mg total) by mouth daily. Swallow whole.  . bicalutamide (CASODEX) 50 MG tablet Take 50 mg by mouth daily.  . Blood Glucose Monitoring Suppl (GLUCOCOM BLOOD GLUCOSE MONITOR) DEVI 1 each by Other route as needed.  . calcium-vitamin D (OSCAL WITH D) 500-200 MG-UNIT TABS tablet Take 1 tablet by mouth 2 (two) times daily.  . Cholecalciferol (VITAMIN D3) 50 MCG (2000 UT) capsule Take 1 tablet by mouth daily.   . cyanocobalamin 1000 MCG tablet Take 1 tablet by mouth daily.  . finasteride (PROSCAR) 5 MG tablet Take 5 mg by mouth daily.  . furosemide (LASIX) 20 MG tablet Take 1 tablet (20 mg total) by mouth daily.  Marland Kitchen glucose blood (ONETOUCH VERIO) test strip 1 each by Other route 3 (three) times daily.  . insulin glargine (LANTUS) 100 UNIT/ML injection Inject 8 Units into the skin daily.  Marland Kitchen lisinopril (ZESTRIL) 20 MG tablet Take 1 tablet (20 mg total)  by mouth daily.  . pantoprazole (PROTONIX) 40 MG tablet Take 1 tablet (40 mg total) by mouth daily.  . polyethylene glycol powder (GLYCOLAX/MIRALAX) 17 GM/SCOOP powder Take 17 g by mouth daily.  . ranolazine (RANEXA) 500 MG 12 hr tablet Take 1 tablet (500 mg total) by mouth 2 (two) times daily.     Allergies:   Galantamine, Metformin and related, Simvastatin, and Irbesartan   Social History   Socioeconomic History  . Marital status: Married    Spouse name: Not on file  . Number of children: Not on file  . Years of education: Not on file  . Highest education level: Not on file  Occupational History  . Not on file  Tobacco Use  . Smoking status: Former Smoker     Packs/day: 0.03    Types: Cigarettes    Quit date: 05/13/1959    Years since quitting: 61.0  . Smokeless tobacco: Never Used  Substance and Sexual Activity  . Alcohol use: Not on file  . Drug use: Not on file  . Sexual activity: Not on file  Other Topics Concern  . Not on file  Social History Narrative  . Not on file   Social Determinants of Health   Financial Resource Strain: Not on file  Food Insecurity: Not on file  Transportation Needs: Not on file  Physical Activity: Not on file  Stress: Not on file  Social Connections: Not on file     Family History: The patient's family history includes Leukemia in his father. There is no history of Hypertension, Diabetes, Heart disease, or Cancer. ROS:   Please see the history of present illness.    All 14 point review of systems negative except as described per history of present illness  EKGs/Labs/Other Studies Reviewed:      Recent Labs: No results found for requested labs within last 8760 hours.  Recent Lipid Panel No results found for: CHOL, TRIG, HDL, CHOLHDL, VLDL, LDLCALC, LDLDIRECT  Physical Exam:    VS:  BP (!) 144/72 (BP Location: Left Arm, Patient Position: Sitting)   Pulse (!) 113   Ht 5\' 5"  (1.651 m)   Wt 145 lb (65.8 kg)   SpO2 93%   BMI 24.13 kg/m     Wt Readings from Last 3 Encounters:  06/01/20 145 lb (65.8 kg)  05/30/20 143 lb 6.4 oz (65 kg)  11/28/19 142 lb 3.2 oz (64.5 kg)     GEN:  Well nourished, well developed in no acute distress HEENT: Normal NECK: No JVD; No carotid bruits LYMPHATICS: No lymphadenopathy CARDIAC: RRR, systolic ejection murmur grade 3/6 best heard right upper portion of the sternum 6, no rubs, no gallops RESPIRATORY:  Clear to auscultation without rales, wheezing or rhonchi  ABDOMEN: Soft, non-tender, non-distended MUSCULOSKELETAL:  No edema; No deformity  SKIN: Warm and dry LOWER EXTREMITIES: no swelling NEUROLOGIC:  Alert and oriented x 3 PSYCHIATRIC:  Normal  affect   ASSESSMENT:    1. Aortic valve stenosis, etiology of cardiac valve disease unspecified   2. Essential hypertension, benign   3. Chest discomfort    PLAN:    In order of problems listed above:  1. Aortic valve stenosis which is critical however because of advanced age medical therapy. 2. Essential hypertension blood pressure slightly elevated today we will continue present management, prefer not to lower blood pressure because of severe aortic stenosis. 3. Chest discomfort discussion as above.   Medication Adjustments/Labs and Tests Ordered: Current medicines are reviewed at  length with the patient today.  Concerns regarding medicines are outlined above.  No orders of the defined types were placed in this encounter.  Medication changes: No orders of the defined types were placed in this encounter.   Signed, Park Liter, MD, [facility] 06/01/2020 2:36 PM    [place]

## 2020-06-04 ENCOUNTER — Ambulatory Visit: Payer: Medicare Other | Admitting: Cardiology

## 2020-06-05 DIAGNOSIS — I10 Essential (primary) hypertension: Secondary | ICD-10-CM

## 2020-06-05 DIAGNOSIS — I350 Nonrheumatic aortic (valve) stenosis: Secondary | ICD-10-CM

## 2020-06-05 DIAGNOSIS — R079 Chest pain, unspecified: Secondary | ICD-10-CM

## 2020-06-05 DIAGNOSIS — I5030 Unspecified diastolic (congestive) heart failure: Secondary | ICD-10-CM

## 2020-06-05 DIAGNOSIS — E119 Type 2 diabetes mellitus without complications: Secondary | ICD-10-CM | POA: Diagnosis not present

## 2020-06-06 DIAGNOSIS — E119 Type 2 diabetes mellitus without complications: Secondary | ICD-10-CM | POA: Diagnosis not present

## 2020-06-06 DIAGNOSIS — I5030 Unspecified diastolic (congestive) heart failure: Secondary | ICD-10-CM | POA: Diagnosis not present

## 2020-06-06 DIAGNOSIS — I10 Essential (primary) hypertension: Secondary | ICD-10-CM | POA: Diagnosis not present

## 2020-06-06 DIAGNOSIS — R079 Chest pain, unspecified: Secondary | ICD-10-CM | POA: Diagnosis not present

## 2020-06-07 DIAGNOSIS — R079 Chest pain, unspecified: Secondary | ICD-10-CM | POA: Diagnosis not present

## 2020-06-07 DIAGNOSIS — I10 Essential (primary) hypertension: Secondary | ICD-10-CM | POA: Diagnosis not present

## 2020-06-07 DIAGNOSIS — E119 Type 2 diabetes mellitus without complications: Secondary | ICD-10-CM | POA: Diagnosis not present

## 2020-06-07 DIAGNOSIS — I5030 Unspecified diastolic (congestive) heart failure: Secondary | ICD-10-CM | POA: Diagnosis not present

## 2020-06-13 ENCOUNTER — Other Ambulatory Visit: Payer: Self-pay

## 2020-06-13 DIAGNOSIS — M810 Age-related osteoporosis without current pathological fracture: Secondary | ICD-10-CM | POA: Insufficient documentation

## 2020-06-13 DIAGNOSIS — D4959 Neoplasm of unspecified behavior of other genitourinary organ: Secondary | ICD-10-CM | POA: Insufficient documentation

## 2020-06-13 DIAGNOSIS — N200 Calculus of kidney: Secondary | ICD-10-CM | POA: Insufficient documentation

## 2020-06-13 DIAGNOSIS — E119 Type 2 diabetes mellitus without complications: Secondary | ICD-10-CM | POA: Insufficient documentation

## 2020-06-13 DIAGNOSIS — F0280 Dementia in other diseases classified elsewhere without behavioral disturbance: Secondary | ICD-10-CM | POA: Insufficient documentation

## 2020-06-13 DIAGNOSIS — I10 Essential (primary) hypertension: Secondary | ICD-10-CM | POA: Insufficient documentation

## 2020-06-13 DIAGNOSIS — M109 Gout, unspecified: Secondary | ICD-10-CM | POA: Insufficient documentation

## 2020-06-13 DIAGNOSIS — M1990 Unspecified osteoarthritis, unspecified site: Secondary | ICD-10-CM | POA: Insufficient documentation

## 2020-06-15 ENCOUNTER — Encounter: Payer: Self-pay | Admitting: Cardiology

## 2020-06-15 ENCOUNTER — Telehealth (INDEPENDENT_AMBULATORY_CARE_PROVIDER_SITE_OTHER): Admitting: Cardiology

## 2020-06-15 VITALS — Ht 65.0 in | Wt 140.0 lb

## 2020-06-15 DIAGNOSIS — J449 Chronic obstructive pulmonary disease, unspecified: Secondary | ICD-10-CM

## 2020-06-15 DIAGNOSIS — I10 Essential (primary) hypertension: Secondary | ICD-10-CM

## 2020-06-15 DIAGNOSIS — I350 Nonrheumatic aortic (valve) stenosis: Secondary | ICD-10-CM

## 2020-06-15 DIAGNOSIS — E782 Mixed hyperlipidemia: Secondary | ICD-10-CM

## 2020-06-15 NOTE — Progress Notes (Signed)
Virtual Visit via Telephone Note   This visit type was conducted due to national recommendations for restrictions regarding the COVID-19 Pandemic (e.g. social distancing) in an effort to limit this patient's exposure and mitigate transmission in our community.  Due to his co-morbid illnesses, this patient is at least at moderate risk for complications without adequate follow up.  This format is felt to be most appropriate for this patient at this time.  The patient did not have access to video technology/had technical difficulties with video requiring transitioning to audio format only (telephone).  All issues noted in this document were discussed and addressed.  No physical exam could be performed with this format.  Please refer to the patient's chart for his  consent to telehealth for [facility].    Date:  06/15/2020   ID:  Kevin Hines, DOB Nov 11, 1922, MRN [id number] The patient was identified using 2 identifiers.  Patient Location: Home Provider Location: Office/Clinic  PCP:  Algis Greenhouse, MD  Cardiologist:  No primary care provider on file. Agustin Cree Electrophysiologist:  None   Evaluation Performed:  Follow-Up Visit  Chief Complaint: Not doing well  History of Present Illness:    Kevin Hines is a 87 y.o. male with with past medical history significant for critical aortic stenosis, however, because of his advanced age 65 years old dementia we decided conservative approach.  His past medical history is also significant for diabetes hypertension dementia.  He does have video visit with me today they were not unable to establish video link therefore we only talk on the phone.  I talked to his daughter.  He is not doing well recently he was in the hospital he spent few days there diuresis has been implemented however still complain of having swelling of lower extremities as well as some shortness of breath and chest pain.  Suspicion is that this is results of advanced  critical aortic stenosis and again because of comorbidities no intervention will be done.  He is already on palliative care.  He is daughter in the medevac gave him some narcotics this morning since that time he has been sleeping.  The patient does not have symptoms concerning for COVID-19 infection (fever, chills, cough, or new shortness of breath).    Past Medical History:  Diagnosis Date  . Abdominal distension 07/22/2019  . Acute posthemorrhagic anemia 04/12/2017   Formatting of this note might be different from the original. 2018: Hosp, 7.9, not eval  . Alzheimer's disease ([place])   . Aortic stenosis severe in 2021, elected medical therapy because of his advanced age 38/10/2019  . Asbestos exposure 04/13/2015   Formatting of this note might be different from the original. Last Assessment & Plan:  His chest x-ray was reassuring from December 2016. No evidence of an evolving effusion. He does not have wheezing on exam. I would like for him to do spirometry today but if this is reassuring think we can almost certainly ascribed some of his exertional dyspnea due to deconditioning. His last CT scan of the che  . Basal cell carcinoma of ala nasi 10/26/2018  . Benign prostatic hypertrophy   . Cancer of prostate ([place]) 09/03/2016  . Chest discomfort 05/30/2020  . Congestive heart failure ([place]) 08/16/2019  . COPD (chronic obstructive pulmonary disease) ([place]) 04/13/2015   Formatting of this note might be different from the original. Last Assessment & Plan:  Obstruction on spirometry and dyspnea that has improved with albuterol both consistent with mild COPD.  He seems to benefit from albuterol, is using it 1-2 times a week. We discussed possibly starting a long-acting schedule bronchodilator but at this time we will defer. He will continue to use his albuterol, keep  . Diabetes mellitus ([place])   . 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 Diverticulosis of colon 04/12/2017    Formatting of this note might be different from the original. 2018: CT scan  . Enlarged prostate with lower urinary tract symptoms (LUTS) 06/08/2019  . Essential hypertension, benign 07/24/2015   Formatting of this note might be different from the original. 2016: stress echo negative, EF 55%, mild AS  . Gout   . Hematuria 09/03/2016  . History of corticosteroid therapy 07/24/2015  . Hyperlipidemia 07/24/2015  . Hypertension   . Insulin long-term use ([place]) 07/24/2015  . Kidney stones   . Nephrolithiasis 09/25/2016  . Nuclear senile cataract 06/04/2019  . Osteoarthritis   . Osteoporosis   . Polymyalgia rheumatica ([place])   . Prostate neoplasm   . Rectal polyp 09/03/2016  . Retinal vascular occlusion 10/26/2018  . Stage 3b chronic kidney disease ([place]) 04/26/2017  . 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 2013: TS - 3.3 2014: alendronate 2015: -3.1 2020: -2.7  . Type 2 diabetes mellitus without complication, with long-term current use of insulin ([place]) 07/24/2015   Past Surgical History:  Procedure Laterality Date  . HEMORRHOID SURGERY       Current Meds  Medication Sig  . acetaminophen (TYLENOL) 325 MG tablet Take 650 mg by mouth every 6 (six) hours as needed.  Marland Kitchen albuterol (PROVENTIL HFA;VENTOLIN HFA) 108 (90 Base) MCG/ACT inhaler Inhale 2 puffs into the lungs every 4 (four) hours as needed for wheezing or shortness of breath.  Marland Kitchen aspirin EC 81 MG tablet Take 1 tablet (81 mg total) by mouth daily. Swallow whole.  Marland Kitchen atorvastatin (LIPITOR) 40 MG tablet Take 1 tablet by mouth daily.  . bicalutamide (CASODEX) 50 MG tablet Take 50 mg by mouth daily.  . cyanocobalamin 1000 MCG tablet Take 1 tablet by mouth daily.  . finasteride (PROSCAR) 5 MG tablet Take 5 mg by mouth daily.  . furosemide (LASIX) 20 MG tablet Take 1 tablet (20 mg total) by mouth daily. (Patient taking differently: Take 60 mg by mouth daily. 3 tab in the morning)  .  HYDROmorphone (DILAUDID) 2 MG tablet Take 1 mg by mouth every 4 (four) hours as needed for severe pain.  Marland Kitchen insulin glargine (LANTUS) 100 UNIT/ML injection Inject 8 Units into the skin daily.  Marland Kitchen LORazepam (ATIVAN) 0.5 MG tablet Take 0.5 mg by mouth every 4 (four) hours as needed for anxiety.  . pantoprazole (PROTONIX) 40 MG tablet Take 1 tablet (40 mg total) by mouth daily.  . polyethylene glycol powder (GLYCOLAX/MIRALAX) 17 GM/SCOOP powder Take 17 g by mouth daily.  . [DISCONTINUED] lisinopril (ZESTRIL) 20 MG tablet Take 1 tablet (20 mg total) by mouth daily.     Allergies:   Galantamine, Metformin and related, Simvastatin, and Irbesartan   Social History   Tobacco Use  . Smoking status: Former Smoker    Packs/day: 0.03    Types: Cigarettes    Quit date: 05/13/1959    Years since quitting: 61.1  . Smokeless tobacco: Never Used     Family Hx: The patient's family history includes Leukemia in his father. There is no history of Hypertension, Diabetes, Heart disease, or Cancer.  ROS:  Please see the history of present illness.    Multiple complaints including weakness fatigue tiredness shortness of breath as well as poor appetite All other systems reviewed and are negative.   Prior CV studies:   The following studies were reviewed today:    Labs/Other Tests and Data Reviewed:    EKG:  No ECG reviewed.  Recent Labs: No results found for requested labs within last 8760 hours.   Recent Lipid Panel No results found for: CHOL, TRIG, HDL, CHOLHDL, LDLCALC, LDLDIRECT  Wt Readings from Last 3 Encounters:  06/15/20 140 lb (63.5 kg)  06/01/20 145 lb (65.8 kg)  05/30/20 143 lb 6.4 oz (65 kg)     Risk Assessment/Calculations:      Objective:    Vital Signs:  Ht 5\' 5"  (1.651 m)   Wt 140 lb (63.5 kg)   BMI 23.30 kg/m    VITAL SIGNS:  reviewed  ASSESSMENT & PLAN:    1. Aortic stenosis which is critical.  Sadly he is not a candidate for any intervention and family  perfectly understand that.  He is in palliative care.  I am afraid his prognosis very grim.  I did talk to his daughter will talk to multiple family members eventually identifiable to establish video link.  He is sleepy however was able to recognize me on the video link.  Nurse was there yesterday and today and they could not measure any blood pressure.  On top of that he did not drink anything since yesterday.  I asked him not to give him any diuretic right now.  We were talking about the fact that he need to get some fluids in also trying to eat some.  Again overall it is a very poor situation and I am afraid his prognosis is very poor.  We decided to talk again in about 2 weeks.  I also asked him to communicate with me if there is anything I help with but I am afraid there is not much I can do here.        COVID-19 Education: The signs and symptoms of COVID-19 were discussed with the patient and how to seek care for testing (follow up with PCP or arrange E-visit).  The importance of social distancing was discussed today.  Time:   Today, I have spent 35 minutes with the patient with telehealth technology discussing the above problems.     Medication Adjustments/Labs and Tests Ordered: Current medicines are reviewed at length with the patient today.  Concerns regarding medicines are outlined above.   Tests Ordered: No orders of the defined types were placed in this encounter.   Medication Changes: No orders of the defined types were placed in this encounter.   Follow Up:  Virtual Visit  in 2 week(s)  Signed, Jenne Campus, MD  06/15/2020 4:03 PM    [facility]

## 2020-06-15 NOTE — Patient Instructions (Signed)
Medication Instructions:  Your physician recommends that you continue on your current medications as directed. Please refer to the Current Medication list given to you today.  *If you need a refill on your cardiac medications before your next appointment, please call your pharmacy*   Lab Work: NONE If you have labs (blood work) drawn today and your tests are completely normal, you will receive your results only by: Marland Kitchen MyChart Message (if you have MyChart) OR . A paper copy in the mail If you have any lab test that is abnormal or we need to change your treatment, we will call you to review the results.   Testing/Procedures: NONE   Follow-Up: At [facility], you and your health needs are our priority.  As part of our continuing mission to provide you with exceptional heart care, we have created designated Provider Care Teams.  These Care Teams include your primary Cardiologist (physician) and Advanced Practice Providers (APPs -  Physician Assistants and Nurse Practitioners) who all work together to provide you with the care you need, when you need it.  We recommend signing up for the patient portal called "MyChart".  Sign up information is provided on this After Visit Summary.  MyChart is used to connect with patients for Virtual Visits (Telemedicine).  Patients are able to view lab/test results, encounter notes, upcoming appointments, etc.  Non-urgent messages can be sent to your provider as well.   To learn more about what you can do with MyChart, go to [email or web address].    Your next appointment:   2 week(s)  The format for your next appointment:   Virtual Visit   Provider:   Jenne Campus, MD   Other Instructions

## 2020-07-03 ENCOUNTER — Telehealth: Payer: Medicare Other | Admitting: Cardiology

## 2020-07-10 DEATH — deceased

## 2020-08-10 ENCOUNTER — Telehealth: Payer: Medicare Other | Admitting: Cardiology
